# Patient Record
Sex: Female | Born: 1980
Health system: Southern US, Community
[De-identification: ages and names within clinical notes are randomized; demographics above are authoritative.]

## PROBLEM LIST (undated history)

## (undated) ENCOUNTER — Inpatient Hospital Stay (HOSPITAL_COMMUNITY): Payer: Self-pay

## (undated) DIAGNOSIS — O139 Gestational [pregnancy-induced] hypertension without significant proteinuria, unspecified trimester: Secondary | ICD-10-CM

## (undated) HISTORY — PX: BACK SURGERY: SHX140

## (undated) HISTORY — PX: WISDOM TOOTH EXTRACTION: SHX21

---

## 2004-03-23 ENCOUNTER — Other Ambulatory Visit: Admission: RE | Admit: 2004-03-23 | Discharge: 2004-03-23 | Payer: Self-pay | Admitting: Obstetrics and Gynecology

## 2004-05-02 ENCOUNTER — Ambulatory Visit (HOSPITAL_COMMUNITY): Admission: RE | Admit: 2004-05-02 | Discharge: 2004-05-02 | Payer: Self-pay | Admitting: Chiropractic Medicine

## 2005-03-28 ENCOUNTER — Other Ambulatory Visit: Admission: RE | Admit: 2005-03-28 | Discharge: 2005-03-28 | Payer: Self-pay | Admitting: Obstetrics and Gynecology

## 2011-04-28 ENCOUNTER — Emergency Department (HOSPITAL_BASED_OUTPATIENT_CLINIC_OR_DEPARTMENT_OTHER)
Admission: EM | Admit: 2011-04-28 | Discharge: 2011-04-28 | Disposition: A | Discharge status: Home / Self Care | Payer: BC Managed Care – PPO | Attending: Emergency Medicine | Admitting: Emergency Medicine

## 2011-04-28 ENCOUNTER — Encounter: Payer: Self-pay | Admitting: *Deleted

## 2011-04-28 DIAGNOSIS — H53149 Visual discomfort, unspecified: Secondary | ICD-10-CM | POA: Insufficient documentation

## 2011-04-28 DIAGNOSIS — F17200 Nicotine dependence, unspecified, uncomplicated: Secondary | ICD-10-CM | POA: Insufficient documentation

## 2011-04-28 DIAGNOSIS — H5710 Ocular pain, unspecified eye: Secondary | ICD-10-CM | POA: Insufficient documentation

## 2011-04-28 DIAGNOSIS — H209 Unspecified iridocyclitis: Secondary | ICD-10-CM | POA: Insufficient documentation

## 2011-04-28 MED ORDER — HOMATROPINE HBR 5 % OP SOLN: 1.0000 [drp] | Freq: Two times a day (BID) | OPHTHALMIC | Status: AC

## 2011-04-28 MED ORDER — PREDNISOLONE ACETATE 1 % OP SUSP: 1.0000 [drp] | Freq: Four times a day (QID) | OPHTHALMIC | Status: AC

## 2011-04-28 MED ORDER — FLUORESCEIN SODIUM 1 MG OP STRP
ORAL_STRIP | OPHTHALMIC | Status: AC
  Filled 2011-04-28: qty 1

## 2011-04-28 MED ORDER — MOXIFLOXACIN HCL 0.5 % OP SOLN: 1.0000 [drp] | Freq: Four times a day (QID) | OPHTHALMIC | Status: AC

## 2011-04-28 MED ORDER — OXYCODONE-ACETAMINOPHEN 5-325 MG PO TABS: 2.0000 | ORAL_TABLET | ORAL | Status: AC | PRN

## 2011-04-28 MED ORDER — OXYCODONE-ACETAMINOPHEN 5-325 MG PO TABS
2.0000 | ORAL_TABLET | Freq: Once | ORAL | Status: AC
  Administered 2011-04-28: 2 via ORAL
  Filled 2011-04-28: qty 2

## 2011-04-28 MED ORDER — TETRACAINE HCL 0.5 % OP SOLN
OPHTHALMIC | Status: AC
  Filled 2011-04-28: qty 2

## 2011-04-28 NOTE — ED Notes (Signed)
Patient states that her eye began to irritate her, so she took her contacts out. Then the eye pain worsened, now drainage and swelling also.

## 2011-04-28 NOTE — ED Provider Notes (Signed)
History     CSN: [id number] Arrival date & time: 04/28/2011  6:07 AM   First MD Initiated Contact with Patient 04/28/11 [id number]      Chief Complaint  Patient presents with  . Eye Pain    (Consider location/radiation/quality/duration/timing/severity/associated sxs/prior treatment) HPI Comments: Pt wears contacts.  Contact started irritating her today, took it out and eye has become increasingly red with watery drainage, diffuse pain.  Was recently treated for red eye/possible conjunctivits about one week ago  Patient is a 30 y.o. female presenting with eye pain. The history is provided by the patient.  Eye Pain This is a new problem. The current episode started 6 to 12 hours ago. The problem occurs constantly. The problem has been gradually worsening. Pertinent negatives include no headaches and no shortness of breath. Exacerbated by: light. The symptoms are relieved by nothing.    History reviewed. No pertinent past medical history.  History reviewed. No pertinent past surgical history.  No family history on file.  History  Substance Use Topics  . Smoking status: Current Everyday Smoker  . Smokeless tobacco: Not on file  . Alcohol Use:     OB History    Grav Para Term Preterm Abortions TAB SAB Ect Mult Living                  Review of Systems  Constitutional: Negative for fever.  HENT: Negative for congestion, rhinorrhea and neck pain.   Eyes: Positive for photophobia, pain, discharge, redness and visual disturbance. Negative for itching.  Respiratory: Negative for cough and shortness of breath.   Gastrointestinal: Negative.  Negative for nausea and vomiting.  Genitourinary: Negative.   Musculoskeletal: Negative.   Skin: Negative for rash.  Neurological: Negative for headaches.    Allergies  Review of patient's allergies indicates no known allergies.  Home Medications   Current Outpatient Rx  Name Route Sig Dispense Refill  . BUPROPION HCL ER (XL) 300 MG PO  TB24 Oral Take 300 mg by mouth daily.      Marland Kitchen HOMATROPINE HBR 5 % OP SOLN Left Eye Place 1 drop into the left eye 2 (two) times daily. 15 mL 0  . MOXIFLOXACIN HCL 0.5 % OP SOLN Left Eye Place 1 drop into the left eye 4 (four) times daily. 3 mL 0  . OXYCODONE-ACETAMINOPHEN 5-325 MG PO TABS Oral Take 2 tablets by mouth every 4 (four) hours as needed for pain. 15 tablet 0  . PREDNISOLONE ACETATE 1 % OP SUSP Left Eye Place 1 drop into the left eye 4 (four) times daily. 5 mL 0    BP 139/83  Pulse 77  Temp 97.9 F (36.6 C)  Resp 20  SpO2 99%  Physical Exam  Constitutional: She is oriented to person, place, and time. She appears well-developed and well-nourished.  HENT:  Head: Normocephalic and atraumatic.  Eyes: Lids are normal. Pupils are equal, round, and reactive to light. No foreign bodies found. Left eye exhibits no exudate. No foreign body present in the left eye. Left conjunctiva is injected. Left conjunctiva has no hemorrhage. Left eye exhibits normal extraocular motion and no nystagmus.  Slit lamp exam:      The left eye shows no corneal abrasion, no corneal flare, no foreign body, no hyphema and no fluorescein uptake.  Neck: Normal range of motion. Neck supple.  Cardiovascular: Normal rate, regular rhythm and normal heart sounds.   Pulmonary/Chest: Effort normal and breath sounds normal. No respiratory distress. She has no  wheezes. She has no rales. She exhibits no tenderness.  Abdominal: Soft. Bowel sounds are normal. There is no tenderness. There is no rebound and no guarding.  Musculoskeletal: Normal range of motion. She exhibits no edema.  Lymphadenopathy:    She has no cervical adenopathy.  Neurological: She is alert and oriented to person, place, and time.  Skin: Skin is warm and dry. No rash noted.  Psychiatric: She has a normal mood and affect.    ED Course  Procedures (including critical care time)  Labs Reviewed - No data to display No results found.   1. Iritis         MDM  Iritis vs conjuntivits vs corneal abrasion   Spoke with Dr. Allyne Gee who is on call for her eye doctor, Dr. Abel Presto.  He recommends starting vigamox, homatropine and predforte drops.  Has appt in 2 days with Dr. Abel Presto, but can call Dr. Allyne Gee if she has worsening problems today.  Advised not to wear contacts       Rolan Bucco, MD 04/28/11 [phone]

## 2014-02-08 ENCOUNTER — Other Ambulatory Visit: Payer: Self-pay | Admitting: Neurological Surgery

## 2014-02-08 DIAGNOSIS — M5416 Radiculopathy, lumbar region: Secondary | ICD-10-CM

## 2014-02-18 ENCOUNTER — Ambulatory Visit
Admission: RE | Admit: 2014-02-18 | Discharge: 2014-02-18 | Disposition: A | Discharge status: Home / Self Care | Payer: BC Managed Care – PPO | Source: Ambulatory Visit | Attending: Neurological Surgery | Admitting: Neurological Surgery

## 2014-02-18 DIAGNOSIS — M5416 Radiculopathy, lumbar region: Secondary | ICD-10-CM

## 2016-03-26 LAB — OB RESULTS CONSOLE HEPATITIS B SURFACE ANTIGEN: Hepatitis B Surface Ag: NEGATIVE

## 2016-03-26 LAB — OB RESULTS CONSOLE ABO/RH: RH Type: POSITIVE

## 2016-03-26 LAB — OB RESULTS CONSOLE HGB/HCT, BLOOD
HEMATOCRIT: 39 %
HEMOGLOBIN: 12.9 g/dL

## 2016-03-26 LAB — OB RESULTS CONSOLE HIV ANTIBODY (ROUTINE TESTING): HIV: NONREACTIVE

## 2016-03-26 LAB — OB RESULTS CONSOLE GC/CHLAMYDIA
CHLAMYDIA, DNA PROBE: NEGATIVE
Gonorrhea: NEGATIVE

## 2016-03-26 LAB — OB RESULTS CONSOLE RPR: RPR: NONREACTIVE

## 2016-03-26 LAB — OB RESULTS CONSOLE PLATELET COUNT: PLATELETS: 269 10*3/uL

## 2016-03-26 LAB — OB RESULTS CONSOLE RUBELLA ANTIBODY, IGM: Rubella: IMMUNE

## 2016-04-27 ENCOUNTER — Inpatient Hospital Stay (HOSPITAL_COMMUNITY): Payer: BLUE CROSS/BLUE SHIELD

## 2016-04-27 ENCOUNTER — Encounter (HOSPITAL_COMMUNITY): Payer: Self-pay

## 2016-04-27 ENCOUNTER — Inpatient Hospital Stay (HOSPITAL_COMMUNITY)
Admission: AD | Admit: 2016-04-27 | Discharge: 2016-04-27 | Disposition: A | Discharge status: Home / Self Care | Payer: BLUE CROSS/BLUE SHIELD | Source: Ambulatory Visit | Attending: Obstetrics and Gynecology | Admitting: Obstetrics and Gynecology

## 2016-04-27 DIAGNOSIS — O209 Hemorrhage in early pregnancy, unspecified: Secondary | ICD-10-CM | POA: Insufficient documentation

## 2016-04-27 DIAGNOSIS — Z79899 Other long term (current) drug therapy: Secondary | ICD-10-CM | POA: Insufficient documentation

## 2016-04-27 DIAGNOSIS — O4692 Antepartum hemorrhage, unspecified, second trimester: Secondary | ICD-10-CM

## 2016-04-27 DIAGNOSIS — Z3A14 14 weeks gestation of pregnancy: Secondary | ICD-10-CM | POA: Insufficient documentation

## 2016-04-27 LAB — URINE MICROSCOPIC-ADD ON

## 2016-04-27 LAB — URINALYSIS, ROUTINE W REFLEX MICROSCOPIC
Bilirubin Urine: NEGATIVE
Glucose, UA: NEGATIVE mg/dL
Ketones, ur: NEGATIVE mg/dL
Leukocytes, UA: NEGATIVE
Nitrite: NEGATIVE
Protein, ur: NEGATIVE mg/dL
Specific Gravity, Urine: 1.02 (ref 1.005–1.030)
pH: 6 (ref 5.0–8.0)

## 2016-04-27 NOTE — Discharge Instructions (Signed)
Keep your appointment in the office on Monday. Pelvic rest. No heavy lifting or strenuous exercise. Return if you have worsening symptoms. Drink at least 8 8-oz glasses of water every day. Continue your prenatal vitamins.

## 2016-04-27 NOTE — MAU Note (Signed)
Patient is [redacted] weeks pregnant has been seen by Dr. Vance GatherLowe's office for this pregnancy, woke up this morning with blood in bed.

## 2016-04-27 NOTE — MAU Provider Note (Signed)
History     CSN: [id number]  Arrival date and time: 04/27/16 [id number]   Seen by provider at 0810    Chief Complaint  Patient presents with  . Vaginal Bleeding   HPI Angel Quinn 35 y.o. 1440w1d Awakened this morning and thought she has lost some urine.  Went to the bathroom and saw blood in the toilet.  Blood was on her sheets.  She did not have cramping at that time.  Sees Dr. Rana SnareLowe for her care and has an appointment scheduled tomorrow.  Vaginal bleeding has decreased at this time.  OB History    Gravida Para Term Preterm AB Living   1             SAB TAB Ectopic Multiple Live Births                  No past medical history on file.  Past Surgical History:  Procedure Laterality Date  . BACK SURGERY    . WISDOM TOOTH EXTRACTION      No family history on file.  Social History  Substance Use Topics  . Smoking status: Never Smoker  . Smokeless tobacco: Never Used  . Alcohol use No    Allergies: No Known Allergies  Prescriptions Prior to Admission  Medication Sig Dispense Refill Last Dose  . buPROPion (WELLBUTRIN XL) 300 MG 24 hr tablet Take 300 mg by mouth daily.     Not Taking at Unknown time    Review of Systems  Constitutional: Negative for fever.  Gastrointestinal: Negative for abdominal pain, nausea and vomiting.  Genitourinary:       No vaginal discharge. Vaginal bleeding. No dysuria.   Physical Exam   Blood pressure 134/72, pulse 90, temperature 98.2 F (36.8 C), temperature source Oral.  Physical Exam  Nursing note and vitals reviewed. Constitutional: She is oriented to person, place, and time. She appears well-developed and well-nourished.  HENT:  Head: Normocephalic.  Eyes: EOM are normal.  Neck: Neck supple.  Respiratory: Effort normal.  GI: Soft. There is no tenderness.  Musculoskeletal: Normal range of motion.  Neurological: She is alert and oriented to person, place, and time.  Skin: Skin is warm and dry.  Psychiatric: She has a  normal mood and affect.    MAU Course  Procedures  Ultrasound done and preliminary report obtained - 1540w1d gestation with FHT of 143 and sbuchorionic hemorrhage 3.7 X 1.6 X 4.8 cm   Results for orders placed or performed during the hospital encounter of 04/27/16 (from the past 24 hour(s))  Urinalysis, Routine w reflex microscopic (not [facility])     Status: Abnormal   Collection Time: 04/27/16  7:35 AM  Result Value Ref Range   Color, Urine YELLOW YELLOW   APPearance CLEAR CLEAR   Specific Gravity, Urine 1.020 1.005 - 1.030   pH 6.0 5.0 - 8.0   Glucose, UA NEGATIVE NEGATIVE mg/dL   Hgb urine dipstick MODERATE (A) NEGATIVE   Bilirubin Urine NEGATIVE NEGATIVE   Ketones, ur NEGATIVE NEGATIVE mg/dL   Protein, ur NEGATIVE NEGATIVE mg/dL   Nitrite NEGATIVE NEGATIVE   Leukocytes, UA NEGATIVE NEGATIVE  Urine microscopic-add on     Status: Abnormal   Collection Time: 04/27/16  7:35 AM  Result Value Ref Range   Squamous Epithelial / LPF 0-5 (A) NONE SEEN   WBC, UA 0-5 0 - 5 WBC/hpf   RBC / HPF 0-5 0 - 5 RBC/hpf   Bacteria, UA MANY (A) NONE SEEN  MDM Discussed the plan of care with Dr. Marcelle OverlieHolland.  Client is not having bleeding by the time of discharge today.  Assessment and Plan  Vaginal bleeding in second trimester secondary to subchorionic hemorrhage  Plan Keep your appointment in the office on Monday. Pelvic rest for now. No treatment for the bleeding is indicated at this time. Return if you have severe vaginal bleeding.  Terri L Burleson 04/27/2016, 9:34 AM

## 2016-07-25 DIAGNOSIS — Z3480 Encounter for supervision of other normal pregnancy, unspecified trimester: Secondary | ICD-10-CM | POA: Diagnosis not present

## 2016-07-25 DIAGNOSIS — Z23 Encounter for immunization: Secondary | ICD-10-CM | POA: Diagnosis not present

## 2016-07-25 DIAGNOSIS — Z3400 Encounter for supervision of normal first pregnancy, unspecified trimester: Secondary | ICD-10-CM | POA: Diagnosis not present

## 2016-10-03 ENCOUNTER — Encounter (HOSPITAL_COMMUNITY): Payer: Self-pay

## 2016-10-03 ENCOUNTER — Encounter (HOSPITAL_COMMUNITY): Payer: Self-pay | Admitting: *Deleted

## 2016-10-03 ENCOUNTER — Inpatient Hospital Stay (HOSPITAL_COMMUNITY)
Admission: AD | Admit: 2016-10-03 | Discharge: 2016-10-03 | Disposition: A | Discharge status: Home / Self Care | Payer: 59 | Source: Ambulatory Visit | Attending: Obstetrics and Gynecology | Admitting: Obstetrics and Gynecology

## 2016-10-03 ENCOUNTER — Encounter (HOSPITAL_COMMUNITY)
Admit: 2016-10-03 | Discharge: 2016-10-03 | Disposition: A | Discharge status: Home / Self Care | Payer: 59 | Attending: Obstetrics & Gynecology | Admitting: Obstetrics & Gynecology

## 2016-10-03 DIAGNOSIS — O1403 Mild to moderate pre-eclampsia, third trimester: Secondary | ICD-10-CM

## 2016-10-03 DIAGNOSIS — O163 Unspecified maternal hypertension, third trimester: Secondary | ICD-10-CM

## 2016-10-03 DIAGNOSIS — Z3480 Encounter for supervision of other normal pregnancy, unspecified trimester: Secondary | ICD-10-CM | POA: Diagnosis not present

## 2016-10-03 DIAGNOSIS — Z3A37 37 weeks gestation of pregnancy: Secondary | ICD-10-CM

## 2016-10-03 DIAGNOSIS — R030 Elevated blood-pressure reading, without diagnosis of hypertension: Secondary | ICD-10-CM | POA: Diagnosis not present

## 2016-10-03 DIAGNOSIS — O1404 Mild to moderate pre-eclampsia, complicating childbirth: Secondary | ICD-10-CM | POA: Diagnosis not present

## 2016-10-03 HISTORY — DX: Gestational (pregnancy-induced) hypertension without significant proteinuria, unspecified trimester: O13.9

## 2016-10-03 LAB — URINALYSIS, ROUTINE W REFLEX MICROSCOPIC
Bilirubin Urine: NEGATIVE
Glucose, UA: NEGATIVE mg/dL
Hgb urine dipstick: NEGATIVE
Ketones, ur: NEGATIVE mg/dL
Leukocytes, UA: NEGATIVE
NITRITE: NEGATIVE
Protein, ur: 30 mg/dL — AB
Specific Gravity, Urine: 1.004 — ABNORMAL LOW (ref 1.005–1.030)
pH: 7 (ref 5.0–8.0)

## 2016-10-03 LAB — COMPREHENSIVE METABOLIC PANEL
ALT: 21 U/L (ref 14–54)
AST: 22 U/L (ref 15–41)
Albumin: 3.1 g/dL — ABNORMAL LOW (ref 3.5–5.0)
Alkaline Phosphatase: 125 U/L (ref 38–126)
Anion gap: 9 (ref 5–15)
BUN: 14 mg/dL (ref 6–20)
CO2: 20 mmol/L — ABNORMAL LOW (ref 22–32)
Calcium: 9.2 mg/dL (ref 8.9–10.3)
Chloride: 104 mmol/L (ref 101–111)
Creatinine, Ser: 0.6 mg/dL (ref 0.44–1.00)
GFR calc Af Amer: >60 mL/min (ref >60)
GFR calc non Af Amer: >60 mL/min (ref >60)
Glucose, Bld: 87 mg/dL (ref 65–99)
Potassium: 4.1 mmol/L (ref 3.5–5.1)
Sodium: 133 mmol/L — ABNORMAL LOW (ref 135–145)
Total Bilirubin: 0.5 mg/dL (ref 0.3–1.2)
Total Protein: 6.7 g/dL (ref 6.5–8.1)

## 2016-10-03 LAB — CBC
HCT: 37.9 % (ref 36.0–46.0)
Hemoglobin: 12.9 g/dL (ref 12.0–15.0)
MCH: 30.4 pg (ref 26.0–34.0)
MCHC: 34 g/dL (ref 30.0–36.0)
MCV: 89.2 fL (ref 78.0–100.0)
Platelets: 253 10*3/uL (ref 150–400)
RBC: 4.25 MIL/uL (ref 3.87–5.11)
RDW: 14.9 % (ref 11.5–15.5)
WBC: 10.8 10*3/uL — ABNORMAL HIGH (ref 4.0–10.5)

## 2016-10-03 LAB — ABO/RH: ABO/RH(D): B POS

## 2016-10-03 LAB — PROTEIN / CREATININE RATIO, URINE
Creatinine, Urine: 19 mg/dL
Protein Creatinine Ratio: 1.63 mg/mg{Cre} — ABNORMAL HIGH (ref 0.00–0.15)
Total Protein, Urine: 31 mg/dL

## 2016-10-03 NOTE — Patient Instructions (Signed)
22 Angel Quinn  10/03/2016   Your procedure is scheduled on:  10/04/2016  Enter through the Main Entrance of [facility] at 0630 AM.  Pick up the phone at the desk and dial [phone].   Call this number if you have problems the morning of surgery: [phone]   Remember:   Do not eat food:After Midnight.  Do not drink clear liquids: After Midnight.  Take these medicines the morning of surgery with A SIP OF WATER: none   Do not wear jewelry, make-up or nail polish.  Do not wear lotions, powders, or perfumes. Do not wear deodorant.  Do not shave 48 hours prior to surgery.  Do not bring valuables to the hospital.  [facility] is not   responsible for any belongings or valuables brought to the hospital.  Contacts, dentures or bridgework may not be worn into surgery.  Leave suitcase in the car. After surgery it may be brought to your room.  For patients admitted to the hospital, checkout time is 11:00 AM the day of              discharge.   Patients discharged the day of surgery will not be allowed to drive             home.  Name and phone number of your driver: na  Special Instructions:   N/A   Please read over the following fact sheets that you were given:   Surgical Site Infection Prevention

## 2016-10-03 NOTE — H&P (Signed)
Larsen Zettel is a 36 y.o. female presenting for primary elective C/S at 37 weeks for pre-eclampsia without severe features.  Antepartum course complicated by history of laminectomy.  The patient does not want TOL.  GBS pending.  OB History    Gravida Para Term Preterm AB Living   1             SAB TAB Ectopic Multiple Live Births                 Past Medical History:  Diagnosis Date  . Pregnancy induced hypertension    Past Surgical History:  Procedure Laterality Date  . BACK SURGERY    . WISDOM TOOTH EXTRACTION     Family History: family history includes Aortic stenosis in her father; Diabetes in her father, mother, and sister; Hyperlipidemia in her father and mother; Hypertension in her father and mother. Social History:  reports that she has never smoked. She has never used smokeless tobacco. She reports that she does not drink alcohol or use drugs.     Maternal Diabetes: No Genetic Screening: Normal Maternal Ultrasounds/Referrals: Normal Fetal Ultrasounds or other Referrals:  None Maternal Substance Abuse:  No Significant Maternal Medications:  None Significant Maternal Lab Results:  None Other Comments:  None  ROS Maternal Medical History:  Prenatal complications: Pre-eclampsia.   Prenatal Complications - Diabetes: none.      There were no vitals taken for this visit. Maternal Exam:  Abdomen: Fundal height is c/w dates.   Estimated fetal weight is 7#.       Physical Exam  Constitutional: She is oriented to person, place, and time. She appears well-developed and well-nourished.  GI: Soft. There is no rebound and no guarding.  Neurological: She is alert and oriented to person, place, and time.  Skin: Skin is warm and dry.  Psychiatric: She has a normal mood and affect. Her behavior is normal.    Prenatal labs: ABO, Rh: --/--/B POS, B POS (04/05 1130) Antibody: NEG (04/05 1130) Rubella:   RPR:    HBsAg:    HIV:    GBS:     Assessment/Plan: 36yo G1 at  37 weeks for primary elective C/S for pre-eclampsia without severe features. -Patient has been counseled re: risk of bleeding, infection, scarring and damage to surrounding structures.  All questions were answered and the patient wishes to proceed.  Calayah Guadarrama 10/03/2016, 6:08 PM

## 2016-10-03 NOTE — H&P (Signed)
Angel Quinn is a 36 y.o. female G1 @ 36+6 wks presenting for evaluation of elevated BP.  Pt was seen in office and noted to have increase in BP with proteinuria.  Denies HA, N/V,and RUQ pain.  Good FM.  Pt scheduled for primary c-section 10/18/16 b/c of h/o laminectomy - declines TOL.  OB History    Gravida Para Term Preterm AB Living   1             SAB TAB Ectopic Multiple Live Births                 History reviewed. No pertinent past medical history. Past Surgical History:  Procedure Laterality Date  . BACK SURGERY    . WISDOM TOOTH EXTRACTION     Family History: family history is not on file. Social History:  reports that she has never smoked. She has never used smokeless tobacco. She reports that she does not drink alcohol or use drugs.     Maternal Diabetes: No Genetic Screening: Declined Maternal Ultrasounds/Referrals: Normal Fetal Ultrasounds or other Referrals:  None Maternal Substance Abuse:  No Significant Maternal Medications:  None Significant Maternal Lab Results:  None Other Comments:  None  ROS History   Blood pressure 130/87, pulse 96, temperature 97.2 F (36.2 C), temperature source Oral, resp. rate 17, height  (1.702 m), weight 248 lb (112.5 kg), SpO2 99 %. Exam Physical Exam  Gen - NAD CV - RRR Lungs - clear Abd - gravid, NT Ext - 2+ bilateral edema  Results for orders placed or performed during the hospital encounter of 10/03/16 (from the past 24 hour(s))  Protein / creatinine ratio, urine     Status: Abnormal   Collection Time: 10/03/16 11:15 AM  Result Value Ref Range   Creatinine, Urine 19.00 mg/dL   Total Protein, Urine 31 mg/dL   Protein Creatinine Ratio 1.63 (H) 0.00 - 0.15 mg/mg[Cre]  Urinalysis, Routine w reflex microscopic     Status: Abnormal   Collection Time: 10/03/16 11:15 AM  Result Value Ref Range   Color, Urine STRAW (A) YELLOW   APPearance CLEAR CLEAR   Specific Gravity, Urine 1.004 (L) 1.005 - 1.030   pH 7.0 5.0 -  8.0   Glucose, UA NEGATIVE NEGATIVE mg/dL   Hgb urine dipstick NEGATIVE NEGATIVE   Bilirubin Urine NEGATIVE NEGATIVE   Ketones, ur NEGATIVE NEGATIVE mg/dL   Protein, ur 30 (A) NEGATIVE mg/dL   Nitrite NEGATIVE NEGATIVE   Leukocytes, UA NEGATIVE NEGATIVE   RBC / HPF 0-5 0 - 5 RBC/hpf   WBC, UA 0-5 0 - 5 WBC/hpf   Bacteria, UA MANY (A) NONE SEEN   Squamous Epithelial / LPF 0-5 (A) NONE SEEN  CBC     Status: Abnormal   Collection Time: 10/03/16 11:30 AM  Result Value Ref Range   WBC 10.8 (H) 4.0 - 10.5 K/uL   RBC 4.25 3.87 - 5.11 MIL/uL   Hemoglobin 12.9 12.0 - 15.0 g/dL   HCT 11.9 14.7 - 82.9 %   MCV 89.2 78.0 - 100.0 fL   MCH 30.4 26.0 - 34.0 pg   MCHC 34.0 30.0 - 36.0 g/dL   RDW 56.2 13.0 - 86.5 %   Platelets 253 150 - 400 K/uL  Comprehensive metabolic panel     Status: Abnormal   Collection Time: 10/03/16 11:30 AM  Result Value Ref Range   Sodium 133 (L) 135 - 145 mmol/L   Potassium 4.1 3.5 - 5.1 mmol/L  Chloride 104 101 - 111 mmol/L   CO2 20 (L) 22 - 32 mmol/L   Glucose, Bld 87 65 - 99 mg/dL   BUN 14 6 - 20 mg/dL   Creatinine, Ser 1.61 0.44 - 1.00 mg/dL   Calcium 9.2 8.9 - 09.6 mg/dL   Total Protein 6.7 6.5 - 8.1 g/dL   Albumin 3.1 (L) 3.5 - 5.0 g/dL   AST 22 15 - 41 U/L   ALT 21 14 - 54 U/L   Alkaline Phosphatase 125 38 - 126 U/L   Total Bilirubin 0.5 0.3 - 1.2 mg/dL   GFR calc non Af Amer >60 >60 mL/min   GFR calc Af Amer >60 >60 mL/min   Anion gap 9 5 - 15     Assessment/Plan: Mild pre-eclampsia - will plan for delivery tomorrow at 37 weeks Pt requests primary c-section - scheduled 10/04/16 @ 8:30am   Angel Quinn 10/03/2016, 1:08 PM

## 2016-10-03 NOTE — MAU Note (Signed)
Pt in office today for routine visit and had protein in her urine and b/p was 150/94. States she has had increased swelling in her feet and hands and has had some floaters in her vision.

## 2016-10-04 ENCOUNTER — Inpatient Hospital Stay (HOSPITAL_COMMUNITY): Payer: 59 | Admitting: Certified Registered Nurse Anesthetist

## 2016-10-04 ENCOUNTER — Inpatient Hospital Stay (HOSPITAL_COMMUNITY)
Admission: RE | Admit: 2016-10-04 | Discharge: 2016-10-08 | DRG: 766 | Disposition: A | Discharge status: Home / Self Care | Payer: 59 | Source: Ambulatory Visit | Attending: Obstetrics & Gynecology | Admitting: Obstetrics & Gynecology

## 2016-10-04 ENCOUNTER — Encounter (HOSPITAL_COMMUNITY)
Admission: RE | Disposition: A | Discharge status: Home / Self Care | Payer: Self-pay | Source: Ambulatory Visit | Attending: Obstetrics & Gynecology

## 2016-10-04 ENCOUNTER — Encounter (HOSPITAL_COMMUNITY): Payer: Self-pay | Admitting: *Deleted

## 2016-10-04 DIAGNOSIS — O1490 Unspecified pre-eclampsia, unspecified trimester: Secondary | ICD-10-CM | POA: Diagnosis present

## 2016-10-04 DIAGNOSIS — R030 Elevated blood-pressure reading, without diagnosis of hypertension: Secondary | ICD-10-CM | POA: Diagnosis present

## 2016-10-04 DIAGNOSIS — O1494 Unspecified pre-eclampsia, complicating childbirth: Secondary | ICD-10-CM | POA: Diagnosis not present

## 2016-10-04 DIAGNOSIS — O1404 Mild to moderate pre-eclampsia, complicating childbirth: Principal | ICD-10-CM | POA: Diagnosis present

## 2016-10-04 DIAGNOSIS — Z3A37 37 weeks gestation of pregnancy: Secondary | ICD-10-CM | POA: Diagnosis not present

## 2016-10-04 LAB — CBC
HCT: 38.3 % (ref 36.0–46.0)
HEMOGLOBIN: 12.8 g/dL (ref 12.0–15.0)
MCH: 29.6 pg (ref 26.0–34.0)
MCHC: 33.4 g/dL (ref 30.0–36.0)
MCV: 88.7 fL (ref 78.0–100.0)
Platelets: 251 10*3/uL (ref 150–400)
RBC: 4.32 MIL/uL (ref 3.87–5.11)
RDW: 15 % (ref 11.5–15.5)
WBC: 10.7 10*3/uL — AB (ref 4.0–10.5)

## 2016-10-04 LAB — COMPREHENSIVE METABOLIC PANEL
ALK PHOS: 118 U/L (ref 38–126)
ALT: 22 U/L (ref 14–54)
AST: 27 U/L (ref 15–41)
Albumin: 3 g/dL — ABNORMAL LOW (ref 3.5–5.0)
Anion gap: 10 (ref 5–15)
BILIRUBIN TOTAL: 0.6 mg/dL (ref 0.3–1.2)
BUN: 15 mg/dL (ref 6–20)
CALCIUM: 9.4 mg/dL (ref 8.9–10.3)
CO2: 19 mmol/L — ABNORMAL LOW (ref 22–32)
CREATININE: 0.59 mg/dL (ref 0.44–1.00)
Chloride: 106 mmol/L (ref 101–111)
GFR calc Af Amer: >60 mL/min (ref >60)
GFR calc non Af Amer: >60 mL/min (ref >60)
GLUCOSE: 97 mg/dL (ref 65–99)
Potassium: 4.3 mmol/L (ref 3.5–5.1)
Sodium: 135 mmol/L (ref 135–145)
TOTAL PROTEIN: 6.5 g/dL (ref 6.5–8.1)

## 2016-10-04 LAB — TYPE AND SCREEN
ABO/RH(D): B POS
ABO/RH(D): B POS
Antibody Screen: NEGATIVE
Antibody Screen: NEGATIVE

## 2016-10-04 LAB — RPR
RPR Ser Ql: NONREACTIVE
RPR: NONREACTIVE

## 2016-10-04 SURGERY — Surgical Case
Anesthesia: Spinal

## 2016-10-04 MED ORDER — FENTANYL CITRATE (PF) 100 MCG/2ML IJ SOLN
INTRAMUSCULAR | Status: AC
  Filled 2016-10-04: qty 2

## 2016-10-04 MED ORDER — SIMETHICONE 80 MG PO CHEW: 80.0000 mg | CHEWABLE_TABLET | ORAL | Status: DC | PRN

## 2016-10-04 MED ORDER — NALBUPHINE HCL 10 MG/ML IJ SOLN: 5.0000 mg | Freq: Once | INTRAMUSCULAR | Status: DC | PRN

## 2016-10-04 MED ORDER — MORPHINE SULFATE (PF) 0.5 MG/ML IJ SOLN
INTRAMUSCULAR | Status: DC | PRN
  Administered 2016-10-04: .2 mg via INTRATHECAL

## 2016-10-04 MED ORDER — DIPHENHYDRAMINE HCL 50 MG/ML IJ SOLN: 12.5000 mg | INTRAMUSCULAR | Status: DC | PRN

## 2016-10-04 MED ORDER — SOD CITRATE-CITRIC ACID 500-334 MG/5ML PO SOLN
30.0000 mL | Freq: Once | ORAL | Status: AC
  Administered 2016-10-04: 30 mL via ORAL
  Filled 2016-10-04: qty 15

## 2016-10-04 MED ORDER — SCOPOLAMINE 1 MG/3DAYS TD PT72
1.0000 | MEDICATED_PATCH | TRANSDERMAL | Status: DC
  Administered 2016-10-04: 1.5 mg via TRANSDERMAL
  Filled 2016-10-04: qty 1

## 2016-10-04 MED ORDER — ONDANSETRON HCL 4 MG/2ML IJ SOLN
INTRAMUSCULAR | Status: DC | PRN
  Administered 2016-10-04: 4 mg via INTRAVENOUS

## 2016-10-04 MED ORDER — DIBUCAINE 1 % RE OINT: 1.0000 "application " | TOPICAL_OINTMENT | RECTAL | Status: DC | PRN

## 2016-10-04 MED ORDER — PHENYLEPHRINE 8 MG IN D5W 100 ML (0.08MG/ML) PREMIX OPTIME
INJECTION | INTRAVENOUS | Status: AC
  Filled 2016-10-04: qty 100

## 2016-10-04 MED ORDER — ONDANSETRON HCL 4 MG/2ML IJ SOLN
INTRAMUSCULAR | Status: AC
  Filled 2016-10-04: qty 2

## 2016-10-04 MED ORDER — ZOLPIDEM TARTRATE 5 MG PO TABS
5.0000 mg | ORAL_TABLET | Freq: Every evening | ORAL | Status: DC | PRN
  Administered 2016-10-06 – 2016-10-07 (×2): 5 mg via ORAL
  Filled 2016-10-04 (×2): qty 1

## 2016-10-04 MED ORDER — KETOROLAC TROMETHAMINE 30 MG/ML IJ SOLN: 30.0000 mg | Freq: Four times a day (QID) | INTRAMUSCULAR | Status: DC | PRN

## 2016-10-04 MED ORDER — SCOPOLAMINE 1 MG/3DAYS TD PT72
1.0000 | MEDICATED_PATCH | Freq: Once | TRANSDERMAL | Status: DC
  Filled 2016-10-04: qty 1

## 2016-10-04 MED ORDER — KETOROLAC TROMETHAMINE 30 MG/ML IJ SOLN
30.0000 mg | Freq: Once | INTRAMUSCULAR | Status: DC | PRN
  Administered 2016-10-04: 30 mg via INTRAVENOUS

## 2016-10-04 MED ORDER — CEFAZOLIN SODIUM-DEXTROSE 2-4 GM/100ML-% IV SOLN
2.0000 g | INTRAVENOUS | Status: AC
  Administered 2016-10-04: 2 g via INTRAVENOUS
  Filled 2016-10-04: qty 100

## 2016-10-04 MED ORDER — OXYTOCIN 10 UNIT/ML IJ SOLN
INTRAMUSCULAR | Status: AC
  Filled 2016-10-04: qty 4

## 2016-10-04 MED ORDER — SENNOSIDES-DOCUSATE SODIUM 8.6-50 MG PO TABS
2.0000 | ORAL_TABLET | ORAL | Status: DC
  Administered 2016-10-05 – 2016-10-07 (×4): 2 via ORAL
  Filled 2016-10-04 (×4): qty 2

## 2016-10-04 MED ORDER — IBUPROFEN 600 MG PO TABS
600.0000 mg | ORAL_TABLET | Freq: Four times a day (QID) | ORAL | Status: DC
  Administered 2016-10-04 – 2016-10-08 (×14): 600 mg via ORAL
  Filled 2016-10-04 (×14): qty 1

## 2016-10-04 MED ORDER — FENTANYL CITRATE (PF) 100 MCG/2ML IJ SOLN
INTRAMUSCULAR | Status: DC | PRN
  Administered 2016-10-04: 10 ug via INTRATHECAL

## 2016-10-04 MED ORDER — MENTHOL 3 MG MT LOZG: 1.0000 | LOZENGE | OROMUCOSAL | Status: DC | PRN

## 2016-10-04 MED ORDER — BUPIVACAINE IN DEXTROSE 0.75-8.25 % IT SOLN
INTRATHECAL | Status: DC | PRN
  Administered 2016-10-04: 1.6 mL via INTRATHECAL

## 2016-10-04 MED ORDER — NALBUPHINE HCL 10 MG/ML IJ SOLN: 5.0000 mg | INTRAMUSCULAR | Status: DC | PRN

## 2016-10-04 MED ORDER — ACETAMINOPHEN 325 MG PO TABS
650.0000 mg | ORAL_TABLET | ORAL | Status: DC | PRN
  Administered 2016-10-07 – 2016-10-08 (×2): 650 mg via ORAL
  Filled 2016-10-04 (×2): qty 2

## 2016-10-04 MED ORDER — SIMETHICONE 80 MG PO CHEW
80.0000 mg | CHEWABLE_TABLET | ORAL | Status: DC
  Administered 2016-10-05 – 2016-10-07 (×4): 80 mg via ORAL
  Filled 2016-10-04 (×5): qty 1

## 2016-10-04 MED ORDER — PROMETHAZINE HCL 25 MG/ML IJ SOLN: 6.2500 mg | INTRAMUSCULAR | Status: DC | PRN

## 2016-10-04 MED ORDER — SODIUM CHLORIDE 0.9% FLUSH
3.0000 mL | INTRAVENOUS | Status: DC | PRN
Start: 2016-10-04 — End: 2016-10-08

## 2016-10-04 MED ORDER — OXYTOCIN 40 UNITS IN LACTATED RINGERS INFUSION - SIMPLE MED: 2.5000 [IU]/h | INTRAVENOUS | Status: DC

## 2016-10-04 MED ORDER — BUPIVACAINE IN DEXTROSE 0.75-8.25 % IT SOLN
INTRATHECAL | Status: AC
  Filled 2016-10-04: qty 2

## 2016-10-04 MED ORDER — ONDANSETRON HCL 4 MG/2ML IJ SOLN
4.0000 mg | Freq: Three times a day (TID) | INTRAMUSCULAR | Status: DC | PRN
  Administered 2016-10-04: 4 mg via INTRAVENOUS
  Filled 2016-10-04: qty 2

## 2016-10-04 MED ORDER — FENTANYL CITRATE (PF) 100 MCG/2ML IJ SOLN: 25.0000 ug | INTRAMUSCULAR | Status: DC | PRN

## 2016-10-04 MED ORDER — DIPHENHYDRAMINE HCL 25 MG PO CAPS
25.0000 mg | ORAL_CAPSULE | ORAL | Status: DC | PRN
  Administered 2016-10-04: 25 mg via ORAL
  Filled 2016-10-04: qty 1

## 2016-10-04 MED ORDER — LACTATED RINGERS IV SOLN
INTRAVENOUS | Status: DC | PRN
  Administered 2016-10-04: 09:00:00 via INTRAVENOUS

## 2016-10-04 MED ORDER — NALOXONE HCL 2 MG/2ML IJ SOSY
1.0000 ug/kg/h | PREFILLED_SYRINGE | INTRAVENOUS | Status: DC | PRN
  Filled 2016-10-04: qty 2

## 2016-10-04 MED ORDER — WITCH HAZEL-GLYCERIN EX PADS: 1.0000 "application " | MEDICATED_PAD | CUTANEOUS | Status: DC | PRN

## 2016-10-04 MED ORDER — SIMETHICONE 80 MG PO CHEW
80.0000 mg | CHEWABLE_TABLET | Freq: Three times a day (TID) | ORAL | Status: DC
  Administered 2016-10-04 – 2016-10-08 (×10): 80 mg via ORAL
  Filled 2016-10-04 (×10): qty 1

## 2016-10-04 MED ORDER — LACTATED RINGERS IV SOLN
INTRAVENOUS | Status: DC
  Administered 2016-10-04 (×2): via INTRAVENOUS

## 2016-10-04 MED ORDER — PRENATAL MULTIVITAMIN CH
1.0000 | ORAL_TABLET | Freq: Every day | ORAL | Status: DC
  Administered 2016-10-05 – 2016-10-07 (×3): 1 via ORAL
  Filled 2016-10-04 (×3): qty 1

## 2016-10-04 MED ORDER — TETANUS-DIPHTH-ACELL PERTUSSIS 5-2.5-18.5 LF-MCG/0.5 IM SUSP: 0.5000 mL | Freq: Once | INTRAMUSCULAR | Status: DC

## 2016-10-04 MED ORDER — MORPHINE SULFATE (PF) 0.5 MG/ML IJ SOLN
INTRAMUSCULAR | Status: AC
Start: 2016-10-04 — End: 2016-10-04
  Filled 2016-10-04: qty 10

## 2016-10-04 MED ORDER — NALOXONE HCL 0.4 MG/ML IJ SOLN: 0.4000 mg | INTRAMUSCULAR | Status: DC | PRN

## 2016-10-04 MED ORDER — SODIUM CHLORIDE 0.9 % IR SOLN
Status: DC | PRN
  Administered 2016-10-04: 1000 mL

## 2016-10-04 MED ORDER — MEPERIDINE HCL 25 MG/ML IJ SOLN: 6.2500 mg | INTRAMUSCULAR | Status: DC | PRN

## 2016-10-04 MED ORDER — DIPHENHYDRAMINE HCL 25 MG PO CAPS: 25.0000 mg | ORAL_CAPSULE | Freq: Four times a day (QID) | ORAL | Status: DC | PRN

## 2016-10-04 MED ORDER — OXYTOCIN 10 UNIT/ML IJ SOLN
INTRAVENOUS | Status: DC | PRN
  Administered 2016-10-04: 40 [IU] via INTRAVENOUS

## 2016-10-04 MED ORDER — COCONUT OIL OIL: 1.0000 "application " | TOPICAL_OIL | Status: DC | PRN

## 2016-10-04 MED ORDER — OXYCODONE-ACETAMINOPHEN 5-325 MG PO TABS: 2.0000 | ORAL_TABLET | ORAL | Status: DC | PRN

## 2016-10-04 MED ORDER — OXYCODONE-ACETAMINOPHEN 5-325 MG PO TABS
1.0000 | ORAL_TABLET | ORAL | Status: DC | PRN
  Administered 2016-10-05: 1 via ORAL
  Filled 2016-10-04: qty 1

## 2016-10-04 MED ORDER — PHENYLEPHRINE 8 MG IN D5W 100 ML (0.08MG/ML) PREMIX OPTIME
INJECTION | INTRAVENOUS | Status: DC | PRN
  Administered 2016-10-04: 60 ug/min via INTRAVENOUS

## 2016-10-04 MED ORDER — KETOROLAC TROMETHAMINE 30 MG/ML IJ SOLN
INTRAMUSCULAR | Status: AC
  Filled 2016-10-04: qty 1

## 2016-10-04 MED ORDER — LACTATED RINGERS IV SOLN
INTRAVENOUS | Status: DC
  Administered 2016-10-04: 125 mL/h via INTRAVENOUS

## 2016-10-04 SURGICAL SUPPLY — 35 items
ADH SKN CLS APL DERMABOND .7 (GAUZE/BANDAGES/DRESSINGS)
APL SKNCLS STERI-STRIP NONHPOA (GAUZE/BANDAGES/DRESSINGS) ×1
BENZOIN TINCTURE PRP APPL 2/3 (GAUZE/BANDAGES/DRESSINGS) ×3 IMPLANT
CHLORAPREP W/TINT 26ML (MISCELLANEOUS) ×3 IMPLANT
CLAMP CORD UMBIL (MISCELLANEOUS) IMPLANT
CLOSURE WOUND 1/2 X4 (GAUZE/BANDAGES/DRESSINGS) ×1
CLOTH BEACON ORANGE TIMEOUT ST (SAFETY) ×3 IMPLANT
DERMABOND ADVANCED (GAUZE/BANDAGES/DRESSINGS)
DERMABOND ADVANCED .7 DNX12 (GAUZE/BANDAGES/DRESSINGS) IMPLANT
DRSG OPSITE POSTOP 4X10 (GAUZE/BANDAGES/DRESSINGS) ×3 IMPLANT
ELECT REM PT RETURN 9FT ADLT (ELECTROSURGICAL) ×3
ELECTRODE REM PT RTRN 9FT ADLT (ELECTROSURGICAL) ×1 IMPLANT
EXTRACTOR VACUUM KIWI (MISCELLANEOUS) IMPLANT
GLOVE BIO SURGEON STRL SZ 6 (GLOVE) ×3 IMPLANT
GLOVE BIOGEL PI IND STRL 6 (GLOVE) ×2 IMPLANT
GLOVE BIOGEL PI IND STRL 7.0 (GLOVE) ×1 IMPLANT
GLOVE BIOGEL PI INDICATOR 6 (GLOVE) ×4
GLOVE BIOGEL PI INDICATOR 7.0 (GLOVE) ×2
GOWN STRL REUS W/TWL LRG LVL3 (GOWN DISPOSABLE) ×6 IMPLANT
KIT ABG SYR 3ML LUER SLIP (SYRINGE) ×3 IMPLANT
NDL HYPO 25X5/8 SAFETYGLIDE (NEEDLE) ×1 IMPLANT
NEEDLE HYPO 25X5/8 SAFETYGLIDE (NEEDLE) ×3 IMPLANT
NS IRRIG 1000ML POUR BTL (IV SOLUTION) ×3 IMPLANT
PACK C SECTION WH (CUSTOM PROCEDURE TRAY) ×3 IMPLANT
PAD OB MATERNITY 4.3X12.25 (PERSONAL CARE ITEMS) ×3 IMPLANT
PENCIL SMOKE EVAC W/HOLSTER (ELECTROSURGICAL) ×3 IMPLANT
STRIP CLOSURE SKIN 1/2X4 (GAUZE/BANDAGES/DRESSINGS) ×1 IMPLANT
SUT CHROMIC 0 CTX 36 (SUTURE) ×9 IMPLANT
SUT MON AB 2-0 CT1 27 (SUTURE) ×3 IMPLANT
SUT PDS AB 0 CT1 27 (SUTURE) IMPLANT
SUT PLAIN 0 NONE (SUTURE) IMPLANT
SUT VIC AB 0 CT1 36 (SUTURE) IMPLANT
SUT VIC AB 4-0 KS 27 (SUTURE) ×2 IMPLANT
TOWEL OR 17X24 6PK STRL BLUE (TOWEL DISPOSABLE) ×3 IMPLANT
TRAY FOLEY BAG SILVER LF 14FR (SET/KITS/TRAYS/PACK) IMPLANT

## 2016-10-04 NOTE — Anesthesia Preprocedure Evaluation (Signed)
Anesthesia Evaluation  Patient identified by MRN, date of birth, ID band Patient awake    Reviewed: Allergy & Precautions, NPO status , Patient's Chart, lab work & pertinent test results  Airway Mallampati: II  TM Distance: >3 FB Neck ROM: Full    Dental  (+) Dental Advisory Given   Pulmonary neg pulmonary ROS,    breath sounds clear to auscultation       Cardiovascular hypertension (Pre-E),  Rhythm:Regular Rate:Normal     Neuro/Psych negative neurological ROS     GI/Hepatic negative GI ROS, Neg liver ROS,   Endo/Other  negative endocrine ROS  Renal/GU negative Renal ROS     Musculoskeletal negative musculoskeletal ROS (+)   Abdominal   Peds  Hematology negative hematology ROS (+)   Anesthesia Other Findings   Reproductive/Obstetrics (+) Pregnancy                             Lab Results  Component Value Date   WBC 10.8 (H) 10/03/2016   HGB 12.9 10/03/2016   HCT 37.9 10/03/2016   MCV 89.2 10/03/2016   PLT 253 10/03/2016   Lab Results  Component Value Date   CREATININE 0.60 10/03/2016   BUN 14 10/03/2016   NA 133 (L) 10/03/2016   K 4.1 10/03/2016   CL 104 10/03/2016   CO2 20 (L) 10/03/2016    Anesthesia Physical Anesthesia Plan  ASA: II  Anesthesia Plan: Spinal   Post-op Pain Management:    Induction:   Airway Management Planned: Natural Airway  Additional Equipment:   Intra-op Plan:   Post-operative Plan:   Informed Consent: I have reviewed the patients History and Physical, chart, labs and discussed the procedure including the risks, benefits and alternatives for the proposed anesthesia with the patient or authorized representative who has indicated his/her understanding and acceptance.     Plan Discussed with: CRNA and Surgeon  Anesthesia Plan Comments:         Anesthesia Quick Evaluation

## 2016-10-04 NOTE — Progress Notes (Signed)
Dr Langston Masker notified of ^ blood pressures with orthostatics.  Pulse remains within normal range

## 2016-10-04 NOTE — Anesthesia Postprocedure Evaluation (Signed)
Anesthesia Post Note  Patient: Angel Quinn  Procedure(s) Performed: Procedure(s) (LRB): CESAREAN SECTION (N/A)  Patient location during evaluation: Mother Baby Anesthesia Type: Spinal Level of consciousness: awake and alert and oriented Pain management: pain level controlled Vital Signs Assessment: post-procedure vital signs reviewed and stable Respiratory status: spontaneous breathing and nonlabored ventilation Cardiovascular status: stable Postop Assessment: no headache, no backache, spinal receding, no signs of nausea or vomiting, patient able to bend at knees and adequate PO intake Anesthetic complications: no        Last Vitals:  Vitals:   10/04/16 1200 10/04/16 1300  BP: 133/73 134/81  Pulse: 74 74  Resp: 18 18  Temp: 36.4 C 36.6 C    Last Pain:  Vitals:   10/04/16 1300  TempSrc: Axillary  PainSc:    Pain Goal: Patients Stated Pain Goal: 4 (10/04/16 0822)               Laban Emperor

## 2016-10-04 NOTE — Anesthesia Postprocedure Evaluation (Signed)
Anesthesia Post Note  Patient: Angel Quinn  Procedure(s) Performed: Procedure(s) (LRB): CESAREAN SECTION (N/A)  Patient location during evaluation: PACU Anesthesia Type: Spinal Level of consciousness: awake and alert Pain management: pain level controlled Vital Signs Assessment: post-procedure vital signs reviewed and stable Respiratory status: spontaneous breathing and respiratory function stable Cardiovascular status: blood pressure returned to baseline and stable Postop Assessment: spinal receding Anesthetic complications: no        Last Vitals:  Vitals:   10/04/16 1036 10/04/16 1042  BP:    Pulse: 83   Resp: 17 17  Temp: 36.5 C     Last Pain:  Vitals:   10/04/16 0822  TempSrc: Oral   Pain Goal: Patients Stated Pain Goal: 4 (10/04/16 0822)               Kennieth Rad

## 2016-10-04 NOTE — Anesthesia Procedure Notes (Signed)
Spinal  Start time: 10/04/2016 8:40 AM End time: 10/04/2016 8:45 AM Staffing Anesthesiologist: Marcene Duos Performed: anesthesiologist  Preanesthetic Checklist Completed: patient identified, site marked, surgical consent, pre-op evaluation, timeout performed, IV checked, risks and benefits discussed and monitors and equipment checked Spinal Block Patient position: sitting Prep: site prepped and draped and DuraPrep Patient monitoring: blood pressure, continuous pulse ox and heart rate Approach: midline Location: L4-5 Injection technique: single-shot Needle Needle type: Pencan  Needle gauge: 24 G Needle length: 9 cm Needle insertion depth: 6 cm Assessment Sensory level: T4

## 2016-10-04 NOTE — Transfer of Care (Signed)
Immediate Anesthesia Transfer of Care Note  Patient: Angel Quinn  Procedure(s) Performed: Procedure(s) with comments: CESAREAN SECTION (N/A) - Tracey to RNFA  Patient Location: PACU  Anesthesia Type:Spinal  Level of Consciousness: awake, alert  and oriented  Airway & Oxygen Therapy: Patient Spontanous Breathing  Post-op Assessment: Report given to RN and Post -op Vital signs reviewed and stable  Post vital signs: Reviewed and stable  Last Vitals:  Vitals:   10/04/16 0822 10/04/16 0943  Pulse:  89  Resp: 18   Temp: 36.9 C     Last Pain:  Vitals:   10/04/16 0822  TempSrc: Oral      Patients Stated Pain Goal: 4 (10/04/16 1610)  Complications: No apparent anesthesia complications

## 2016-10-04 NOTE — Addendum Note (Signed)
Addendum  created 10/04/16 1354 by Drezden Seitzinger H Karlin Binion, CRNA   Sign clinical note    

## 2016-10-04 NOTE — Op Note (Signed)
Angel Quinn PROCEDURE DATE: 10/04/2016  PREOPERATIVE DIAGNOSIS: Intrauterine pregnancy at  [redacted]w[redacted]d weeks gestation, pre-eclampsia without severe features, elective cesarean  POSTOPERATIVE DIAGNOSIS: The same  PROCEDURE:    Low Transverse Cesarean Section  SURGEON:  Dr. Mitchel Honour  ASSISTANT: Mamie Levers, RNFA  INDICATIONS: Angel Quinn is a 36 y.o. G1P0 at [redacted]w[redacted]d scheduled for cesarean section secondary pre-eclampsia, patient request for elective C/S.  The risks of cesarean section discussed with the patient included but were not limited to: bleeding which may require transfusion or reoperation; infection which may require antibiotics; injury to bowel, bladder, ureters or other surrounding organs; injury to the fetus; need for additional procedures including hysterectomy in the event of a life-threatening hemorrhage; placental abnormalities wth subsequent pregnancies, incisional problems, thromboembolic phenomenon and other postoperative/anesthesia complications. The patient concurred with the proposed plan, giving informed written consent for the procedure.    FINDINGS:  Viable female infant in cephalic presentation, APGARs :  Weight pending Clear amniotic fluid.  Intact placenta, three vessel cord.  Grossly normal uterus, ovaries and fallopian tubes. .   ANESTHESIA:  Spinal ESTIMATED BLOOD LOSS: 700 ml SPECIMENS: Placenta sent to L&D COMPLICATIONS: None immediate  PROCEDURE IN DETAIL:  The patient received intravenous antibiotics and had sequential compression devices applied to her lower extremities while in the preoperative area.  She was then taken to the operating room where spinal anesthesia was administered and was found to be adequate. She was then placed in a dorsal supine position with a leftward tilt, and prepped and draped in a sterile manner.  A foley catheter was placed into her bladder and attached to constant gravity.  After an adequate timeout was performed, a Pfannenstiel  skin incision was made with scalpel and carried through to the underlying layer of fascia. The fascia was incised in the midline and this incision was extended bilaterally using the Mayo scissors. Kocher clamps were applied to the superior aspect of the fascial incision and the underlying rectus muscles were dissected off bluntly. A similar process was carried out on the inferior aspect of the facial incision. The rectus muscles were separated in the midline bluntly and the peritoneum was entered bluntly.   A transverse hysterotomy was made with a scalpel and extended bilaterally bluntly. The bladder blade was then removed. The infant was successfully delivered, and cord was clamped and cut and infant was handed over to awaiting neonatology team. Uterine massage was then administered and the placenta delivered intact with three-vessel cord. The uterus was cleared of clot and debris.  The hysterotomy was closed with 0 chromic.  A second imbricating suture of 0-chromic was used to reinforce the incision and aid in hemostasis.  The peritoneum and rectus muscles were noted to be hemostatic and was reapproximated using 3-0 monocryl in a running fashion.  The fascia was closed with 0-Vicryl in a running fashion with good restoration of anatomy.  The subcutaneus tissue was copiously irrigated.  The skin was closed with 4-0 vicryl in a subcuticular fashion.  Pt tolerated the procedure will.  All counts were correct x2.  Pt went to the recovery room in stable condition.

## 2016-10-04 NOTE — Progress Notes (Signed)
No change to H&P.  Laverne Klugh, DO 

## 2016-10-05 LAB — BIRTH TISSUE RECOVERY COLLECTION (PLACENTA DONATION)

## 2016-10-05 LAB — COMPREHENSIVE METABOLIC PANEL
ALT: 16 U/L (ref 14–54)
ANION GAP: 6 (ref 5–15)
AST: 21 U/L (ref 15–41)
Albumin: 2.4 g/dL — ABNORMAL LOW (ref 3.5–5.0)
Alkaline Phosphatase: 86 U/L (ref 38–126)
BUN: 14 mg/dL (ref 6–20)
CHLORIDE: 106 mmol/L (ref 101–111)
CO2: 24 mmol/L (ref 22–32)
Calcium: 8.7 mg/dL — ABNORMAL LOW (ref 8.9–10.3)
Creatinine, Ser: 0.72 mg/dL (ref 0.44–1.00)
GFR calc Af Amer: >60 mL/min (ref >60)
Glucose, Bld: 108 mg/dL — ABNORMAL HIGH (ref 65–99)
POTASSIUM: 3.8 mmol/L (ref 3.5–5.1)
Sodium: 136 mmol/L (ref 135–145)
Total Bilirubin: 0.3 mg/dL (ref 0.3–1.2)
Total Protein: 5.7 g/dL — ABNORMAL LOW (ref 6.5–8.1)

## 2016-10-05 LAB — CBC
HCT: 31.8 % — ABNORMAL LOW (ref 36.0–46.0)
Hemoglobin: 10.8 g/dL — ABNORMAL LOW (ref 12.0–15.0)
MCH: 30.5 pg (ref 26.0–34.0)
MCHC: 34 g/dL (ref 30.0–36.0)
MCV: 89.8 fL (ref 78.0–100.0)
PLATELETS: 207 10*3/uL (ref 150–400)
RBC: 3.54 MIL/uL — ABNORMAL LOW (ref 3.87–5.11)
RDW: 15.3 % (ref 11.5–15.5)
WBC: 10.7 10*3/uL — AB (ref 4.0–10.5)

## 2016-10-05 NOTE — Progress Notes (Signed)
Subjective: Postpartum Day 1: Cesarean Delivery Patient reports tolerating PO and no problems voiding.  No HA, CP/SOB, RUQ pain or visual disturbance.  Baby in NICU for resp support.  Objective: Vital signs in last 24 hours: Temp:  [97.6 F (36.4 C)-98.7 F (37.1 C)] 98.1 F (36.7 C) (04/07 0615) Pulse Rate:  [73-93] 78 (04/07 0615) Resp:  [16-27] 18 (04/07 0615) BP: (123-141)/(65-90) 123/75 (04/07 0615) SpO2:  [97 %-100 %] 99 % (04/07 0615)  Physical Exam:  General: alert, cooperative and appears stated age Lochia: appropriate Uterine Fundus: firm Incision: healing well, no significant drainage, no dehiscence DVT Evaluation: No evidence of DVT seen on physical exam. Negative Homan's sign. No cords or calf tenderness.   Recent Labs  10/04/16 0720 10/05/16 0529  HGB 12.8 10.8*  HCT 38.3 31.8*    Assessment/Plan: Status post Cesarean section. Doing well postoperatively.  Continue current care. Pre-eclampsia-BPs in normal range.  No medication.  Pre-eclampsia labs wnl.  Will monitor.  Marilu Rylander 10/05/2016, 9:15 AM

## 2016-10-05 NOTE — Progress Notes (Signed)
CSW acknowledges NICU admission. CSW met with MOB at bedside to complete assessment and offer support as needed. MOB denied the need for anything at this time. CSW inquired about hx of anxiety/depression. MOB noted it is managed and doe snot need any referrals at this time.   Blaiden Werth, MSW, LCSW-A Clinical Social Worker  [facility]  Office: [phone]     

## 2016-10-06 NOTE — Progress Notes (Signed)
Subjective: Postpartum Day 2: Cesarean Delivery Patient reports tolerating PO, + flatus and no problems voiding.  No HA, CP/SOB, RUQ pain or visual disturbance.  Baby in NICU for respiratory support.  Objective: Vital signs in last 24 hours: Temp:  [98.5 F (36.9 C)] 98.5 F (36.9 C) (04/07 1739) Pulse Rate:  [90] 90 (04/07 1739) Resp:  [19] 19 (04/07 1739) BP: (147-153)/(80-85) 147/80 (04/07 1826)  Physical Exam:  General: alert, cooperative and appears stated age Lochia: appropriate Uterine Fundus: firm Incision: healing well, no significant drainage, no dehiscence DVT Evaluation: No evidence of DVT seen on physical exam. Negative Homan's sign. No cords or calf tenderness.   Recent Labs  10/04/16 0720 10/05/16 0529  HGB 12.8 10.8*  HCT 38.3 31.8*    Assessment/Plan: Status post Cesarean section. Doing well postoperatively.  Continue current care. Pre-eclampsia-BP stable on no meds.  Continue to monitor.  Angel Quinn 10/06/2016, 10:01 AM

## 2016-10-07 ENCOUNTER — Encounter (HOSPITAL_COMMUNITY): Payer: Self-pay | Admitting: *Deleted

## 2016-10-07 MED ORDER — IBUPROFEN 600 MG PO TABS: 600.0000 mg | ORAL_TABLET | Freq: Four times a day (QID) | ORAL | 1 refills | Status: DC

## 2016-10-07 MED ORDER — BUPROPION HCL ER (XL) 150 MG PO TB24
150.0000 mg | ORAL_TABLET | Freq: Every day | ORAL | Status: DC
  Administered 2016-10-07 – 2016-10-08 (×2): 150 mg via ORAL
  Filled 2016-10-07 (×2): qty 1

## 2016-10-07 MED ORDER — FLUOXETINE HCL 20 MG PO CAPS
20.0000 mg | ORAL_CAPSULE | Freq: Every day | ORAL | Status: DC
  Administered 2016-10-07 – 2016-10-08 (×2): 20 mg via ORAL
  Filled 2016-10-07 (×2): qty 1

## 2016-10-07 NOTE — Progress Notes (Addendum)
Subjective: Postpartum Day 3: Cesarean Delivery Patient reports incisional pain, tolerating PO and no problems voiding.  Baby in NICU.  Denies Severe PIH sxs  Objective: Vital signs in last 24 hours: Temp:  [97.5 F (36.4 C)-98.1 F (36.7 C)] 98.1 F (36.7 C) (04/09 0500) Pulse Rate:  [84-95] 84 (04/09 0500) Resp:  [20] 20 (04/09 0500) BP: (135-145)/(68-84) 135/68 (04/09 0500)  Physical Exam:  General: alert, cooperative, appears stated age and mild distress Lochia: appropriate Uterine Fundus: firm Incision: healing well DVT Evaluation: No evidence of DVT seen on physical exam.   Recent Labs  10/05/16 0529  HGB 10.8*  HCT 31.8*    Assessment/Plan: Status post Cesarean section. Doing well postoperatively.  Continue current care Baby in NICU.  Plan d/c tomorrow. Pt wants to restart her home meds - prozac and wellbutrin Kentaro Alewine C 10/07/2016, 9:18 AM

## 2016-10-07 NOTE — Discharge Summary (Signed)
Obstetric Discharge Summary Reason for Admission: cesarean section Prenatal Procedures: ultrasound Intrapartum Procedures: cesarean: low cervical, transverse Postpartum Procedures: none Complications-Operative and Postpartum: none Hemoglobin  Date Value Ref Range Status  10/05/2016 10.8 (L) 12.0 - 15.0 g/dL Final  40/98/1191 47.8 g/dL Final   HCT  Date Value Ref Range Status  10/05/2016 31.8 (L) 36.0 - 46.0 % Final  03/26/2016 39 % Final    Physical Exam:  General: alert and cooperative and tearful Lochia: appropriate Uterine Fundus: firm Incision: healing well DVT Evaluation: No evidence of DVT seen on physical exam. Negative Homan's sign. No cords or calf tenderness. Calf/Ankle edema is present.  Discharge Diagnoses: Term Pregnancy-delivered and Preelampsia  Discharge Information: Date: 10/07/2016 Activity: pelvic rest Diet: routine Medications: PNV and Ibuprofen. Discussed starting Zoloft . Condition: stable Instructions: refer to practice specific booklet Discharge to: home   Newborn Data: Live born female  Birth Weight:   APGAR: 8, 9  Baby in NICU  CURTIS,CAROL G 10/07/2016, 8:09 AM

## 2016-10-08 MED ORDER — BUPROPION HCL ER (XL) 150 MG PO TB24: 150.0000 mg | ORAL_TABLET | Freq: Every day | ORAL | 1 refills | Status: DC

## 2016-10-08 MED ORDER — ZOLPIDEM TARTRATE 5 MG PO TABS: 5.0000 mg | ORAL_TABLET | Freq: Every evening | ORAL | 0 refills | Status: DC | PRN

## 2016-10-08 MED ORDER — FLUOXETINE HCL 20 MG PO CAPS: 20.0000 mg | ORAL_CAPSULE | Freq: Every day | ORAL | 1 refills | Status: DC

## 2016-10-08 NOTE — Lactation Note (Signed)
This note was copied from a baby's chart. Lactation Consultation Note  Patient Name: Girl Kiala Faraj [id number] Date: 10/08/2016 Reason for consult: Initial assessment;NICU baby    With this mom of an early term baby, Now 53 hours old, and in NICU, . Mom asked to see lactation, and I saw her at baby's bedside. Both parents were there, dad was feeding the baby a bottle.Mom is enngorged, and was asking for advice.   When I suggested mom begin with pumping with DEP, mom stated she did not want to pump or breast feed, and the only reason she has done so, is because dad wanted her to. Dad said it is not his decision,it is about her and her baby. I told mom EBM was better for her baby, but if she does not want to pump, it si her body, her baby, and her decision. Wheat is best for her will be best for her baby. I reviewed with mom to wear a comfortable bra, use ice and take her motrin around the clock. Mom is restarting Prozac and Welbutrin, which are both compatible with breast feeding, and I told mom this,, but again, it is her choice. She was upset that she would have to pump every 3 hours, and clearly, this was not something she wanted to do. I told the parents to have further discussion about this, and if theh need me, to have Verlon Au, their NICu RN, to call me to come back. I brought mom ice packs to use.    Maternal Data    Feeding Feeding Type: Formula Nipple Type: Slow - flow Length of feed: 30 min  LATCH Score/Interventions          Comfort (Breast/Nipple): Engorged, cracked, bleeding, large blisters, severe discomfort Problem noted: Engorgment Intervention(s): Ice  Problem noted: Mild/Moderate discomfort;Filling Interventions (Filling): Hand pump;Double electric pump Interventions (Mild/moderate discomfort): Reverse pressue        Lactation Tools Discussed/Used     Consult Status Consult Status: Complete Follow-up type: Call as needed    Alfred Levins 10/08/2016,  8:31 AM

## 2016-10-08 NOTE — Progress Notes (Signed)
Subjective: Postpartum Day 4: Cesarean Delivery Patient reports tolerating PO, + flatus and no problems voiding.    Objective: Vital signs in last 24 hours: Temp:  [97.6 F (36.4 C)-97.7 F (36.5 C)] 97.6 F (36.4 C) (04/10 0558) Pulse Rate:  [82-98] 82 (04/10 0558) Resp:  [20] 20 (04/10 0558) BP: (147-162)/(83-92) 152/83 (04/10 4540)  Physical Exam:  General: alert and cooperative Lochia: appropriate Uterine Fundus: firm Incision: healing well DVT Evaluation: No evidence of DVT seen on physical exam. Negative Homan's sign. No cords or calf tenderness. Calf/Ankle edema is present.  No results for input(s): HGB, HCT in the last 72 hours.  Assessment/Plan: Status post Cesarean section. Doing well postoperatively.  Discharge home with standard precautions and return to clinic in 1-2 weeks.  Kaetlyn Noa G 10/08/2016, 8:11 AM

## 2016-10-08 NOTE — Progress Notes (Addendum)
RN called Dr. Rana Snare regarding patient's blood pressure of 162/90. Also RN informed Doctor that patient stated that she sometimes has floaters. Orders were given to take BP again in 1 hour. Parameters of 160/105 or greater were given for calling the doctor.

## 2016-11-12 LAB — HM PAP SMEAR

## 2016-11-14 DIAGNOSIS — Z1389 Encounter for screening for other disorder: Secondary | ICD-10-CM | POA: Diagnosis not present

## 2016-11-14 MED FILL — LARIN FE 1.5-30 TABLET: 1.5-30 | 28 days supply | Qty: 28 | Fill #0

## 2016-11-14 MED FILL — BUPROPION HCL XL 300 MG TAB: 300 | 30 days supply | Qty: 30 | Fill #0

## 2016-12-26 MED FILL — BUPROPION HCL XL 300 MG TAB: 300 | 30 days supply | Qty: 30 | Fill #1

## 2017-01-07 DIAGNOSIS — H5213 Myopia, bilateral: Secondary | ICD-10-CM | POA: Diagnosis not present

## 2017-02-04 MED FILL — ADDERALL XR 20 MG CAP SA: 20 | 30 days supply | Qty: 60 | Fill #0

## 2017-02-19 DIAGNOSIS — M9901 Segmental and somatic dysfunction of cervical region: Secondary | ICD-10-CM | POA: Diagnosis not present

## 2017-02-19 DIAGNOSIS — G540 Brachial plexus disorders: Secondary | ICD-10-CM | POA: Diagnosis not present

## 2017-02-19 DIAGNOSIS — M609 Myositis, unspecified: Secondary | ICD-10-CM | POA: Diagnosis not present

## 2017-02-19 DIAGNOSIS — M9902 Segmental and somatic dysfunction of thoracic region: Secondary | ICD-10-CM | POA: Diagnosis not present

## 2017-02-19 DIAGNOSIS — R202 Paresthesia of skin: Secondary | ICD-10-CM | POA: Diagnosis not present

## 2017-02-24 DIAGNOSIS — R202 Paresthesia of skin: Secondary | ICD-10-CM | POA: Diagnosis not present

## 2017-02-24 DIAGNOSIS — G540 Brachial plexus disorders: Secondary | ICD-10-CM | POA: Diagnosis not present

## 2017-02-24 DIAGNOSIS — M9902 Segmental and somatic dysfunction of thoracic region: Secondary | ICD-10-CM | POA: Diagnosis not present

## 2017-02-24 DIAGNOSIS — M9901 Segmental and somatic dysfunction of cervical region: Secondary | ICD-10-CM | POA: Diagnosis not present

## 2017-02-24 DIAGNOSIS — M609 Myositis, unspecified: Secondary | ICD-10-CM | POA: Diagnosis not present

## 2017-03-05 DIAGNOSIS — R202 Paresthesia of skin: Secondary | ICD-10-CM | POA: Diagnosis not present

## 2017-03-05 DIAGNOSIS — G540 Brachial plexus disorders: Secondary | ICD-10-CM | POA: Diagnosis not present

## 2017-03-05 DIAGNOSIS — M609 Myositis, unspecified: Secondary | ICD-10-CM | POA: Diagnosis not present

## 2017-03-05 DIAGNOSIS — M9902 Segmental and somatic dysfunction of thoracic region: Secondary | ICD-10-CM | POA: Diagnosis not present

## 2017-03-05 DIAGNOSIS — M9901 Segmental and somatic dysfunction of cervical region: Secondary | ICD-10-CM | POA: Diagnosis not present

## 2017-03-13 DIAGNOSIS — G540 Brachial plexus disorders: Secondary | ICD-10-CM | POA: Diagnosis not present

## 2017-03-13 DIAGNOSIS — M609 Myositis, unspecified: Secondary | ICD-10-CM | POA: Diagnosis not present

## 2017-03-13 DIAGNOSIS — M9902 Segmental and somatic dysfunction of thoracic region: Secondary | ICD-10-CM | POA: Diagnosis not present

## 2017-03-13 DIAGNOSIS — M9901 Segmental and somatic dysfunction of cervical region: Secondary | ICD-10-CM | POA: Diagnosis not present

## 2017-03-13 DIAGNOSIS — R202 Paresthesia of skin: Secondary | ICD-10-CM | POA: Diagnosis not present

## 2017-03-19 DIAGNOSIS — M609 Myositis, unspecified: Secondary | ICD-10-CM | POA: Diagnosis not present

## 2017-03-19 DIAGNOSIS — M9901 Segmental and somatic dysfunction of cervical region: Secondary | ICD-10-CM | POA: Diagnosis not present

## 2017-03-19 DIAGNOSIS — M9902 Segmental and somatic dysfunction of thoracic region: Secondary | ICD-10-CM | POA: Diagnosis not present

## 2017-03-19 DIAGNOSIS — G540 Brachial plexus disorders: Secondary | ICD-10-CM | POA: Diagnosis not present

## 2017-03-19 DIAGNOSIS — R202 Paresthesia of skin: Secondary | ICD-10-CM | POA: Diagnosis not present

## 2017-03-20 DIAGNOSIS — M9901 Segmental and somatic dysfunction of cervical region: Secondary | ICD-10-CM | POA: Diagnosis not present

## 2017-03-20 DIAGNOSIS — M609 Myositis, unspecified: Secondary | ICD-10-CM | POA: Diagnosis not present

## 2017-03-20 DIAGNOSIS — R202 Paresthesia of skin: Secondary | ICD-10-CM | POA: Diagnosis not present

## 2017-03-20 DIAGNOSIS — M9902 Segmental and somatic dysfunction of thoracic region: Secondary | ICD-10-CM | POA: Diagnosis not present

## 2017-03-20 DIAGNOSIS — G540 Brachial plexus disorders: Secondary | ICD-10-CM | POA: Diagnosis not present

## 2017-03-24 DIAGNOSIS — M9902 Segmental and somatic dysfunction of thoracic region: Secondary | ICD-10-CM | POA: Diagnosis not present

## 2017-03-24 DIAGNOSIS — G540 Brachial plexus disorders: Secondary | ICD-10-CM | POA: Diagnosis not present

## 2017-03-24 DIAGNOSIS — M9901 Segmental and somatic dysfunction of cervical region: Secondary | ICD-10-CM | POA: Diagnosis not present

## 2017-03-24 DIAGNOSIS — R202 Paresthesia of skin: Secondary | ICD-10-CM | POA: Diagnosis not present

## 2017-03-24 DIAGNOSIS — M609 Myositis, unspecified: Secondary | ICD-10-CM | POA: Diagnosis not present

## 2017-03-25 ENCOUNTER — Other Ambulatory Visit: Payer: Self-pay | Admitting: Chiropractic Medicine

## 2017-03-25 DIAGNOSIS — Z9889 Other specified postprocedural states: Secondary | ICD-10-CM

## 2017-03-26 DIAGNOSIS — M609 Myositis, unspecified: Secondary | ICD-10-CM | POA: Diagnosis not present

## 2017-03-26 DIAGNOSIS — G540 Brachial plexus disorders: Secondary | ICD-10-CM | POA: Diagnosis not present

## 2017-03-26 DIAGNOSIS — M9901 Segmental and somatic dysfunction of cervical region: Secondary | ICD-10-CM | POA: Diagnosis not present

## 2017-03-26 DIAGNOSIS — M9902 Segmental and somatic dysfunction of thoracic region: Secondary | ICD-10-CM | POA: Diagnosis not present

## 2017-03-26 DIAGNOSIS — R202 Paresthesia of skin: Secondary | ICD-10-CM | POA: Diagnosis not present

## 2017-03-31 DIAGNOSIS — G540 Brachial plexus disorders: Secondary | ICD-10-CM | POA: Diagnosis not present

## 2017-03-31 DIAGNOSIS — M9901 Segmental and somatic dysfunction of cervical region: Secondary | ICD-10-CM | POA: Diagnosis not present

## 2017-03-31 DIAGNOSIS — R202 Paresthesia of skin: Secondary | ICD-10-CM | POA: Diagnosis not present

## 2017-03-31 DIAGNOSIS — M9902 Segmental and somatic dysfunction of thoracic region: Secondary | ICD-10-CM | POA: Diagnosis not present

## 2017-03-31 DIAGNOSIS — M609 Myositis, unspecified: Secondary | ICD-10-CM | POA: Diagnosis not present

## 2017-03-31 MED FILL — ADDERALL XR 20 MG CAP SA: 20 | 30 days supply | Qty: 60 | Fill #0

## 2017-04-02 DIAGNOSIS — M9902 Segmental and somatic dysfunction of thoracic region: Secondary | ICD-10-CM | POA: Diagnosis not present

## 2017-04-02 DIAGNOSIS — G540 Brachial plexus disorders: Secondary | ICD-10-CM | POA: Diagnosis not present

## 2017-04-02 DIAGNOSIS — M9901 Segmental and somatic dysfunction of cervical region: Secondary | ICD-10-CM | POA: Diagnosis not present

## 2017-04-02 DIAGNOSIS — R202 Paresthesia of skin: Secondary | ICD-10-CM | POA: Diagnosis not present

## 2017-04-02 DIAGNOSIS — M609 Myositis, unspecified: Secondary | ICD-10-CM | POA: Diagnosis not present

## 2017-04-03 ENCOUNTER — Ambulatory Visit
Admission: RE | Admit: 2017-04-03 | Discharge: 2017-04-03 | Disposition: A | Discharge status: Home / Self Care | Payer: 59 | Source: Ambulatory Visit | Attending: Chiropractic Medicine | Admitting: Chiropractic Medicine

## 2017-04-03 DIAGNOSIS — Z9889 Other specified postprocedural states: Secondary | ICD-10-CM

## 2017-04-03 DIAGNOSIS — M5126 Other intervertebral disc displacement, lumbar region: Secondary | ICD-10-CM | POA: Diagnosis not present

## 2017-04-07 DIAGNOSIS — M9902 Segmental and somatic dysfunction of thoracic region: Secondary | ICD-10-CM | POA: Diagnosis not present

## 2017-04-07 DIAGNOSIS — R202 Paresthesia of skin: Secondary | ICD-10-CM | POA: Diagnosis not present

## 2017-04-07 DIAGNOSIS — M609 Myositis, unspecified: Secondary | ICD-10-CM | POA: Diagnosis not present

## 2017-04-07 DIAGNOSIS — G540 Brachial plexus disorders: Secondary | ICD-10-CM | POA: Diagnosis not present

## 2017-04-07 DIAGNOSIS — M9901 Segmental and somatic dysfunction of cervical region: Secondary | ICD-10-CM | POA: Diagnosis not present

## 2017-04-08 ENCOUNTER — Other Ambulatory Visit: Payer: 59

## 2017-04-09 DIAGNOSIS — G540 Brachial plexus disorders: Secondary | ICD-10-CM | POA: Diagnosis not present

## 2017-04-09 DIAGNOSIS — M609 Myositis, unspecified: Secondary | ICD-10-CM | POA: Diagnosis not present

## 2017-04-09 DIAGNOSIS — R202 Paresthesia of skin: Secondary | ICD-10-CM | POA: Diagnosis not present

## 2017-04-09 DIAGNOSIS — M9901 Segmental and somatic dysfunction of cervical region: Secondary | ICD-10-CM | POA: Diagnosis not present

## 2017-04-09 DIAGNOSIS — M9902 Segmental and somatic dysfunction of thoracic region: Secondary | ICD-10-CM | POA: Diagnosis not present

## 2017-04-14 DIAGNOSIS — M9902 Segmental and somatic dysfunction of thoracic region: Secondary | ICD-10-CM | POA: Diagnosis not present

## 2017-04-14 DIAGNOSIS — R202 Paresthesia of skin: Secondary | ICD-10-CM | POA: Diagnosis not present

## 2017-04-14 DIAGNOSIS — G540 Brachial plexus disorders: Secondary | ICD-10-CM | POA: Diagnosis not present

## 2017-04-14 DIAGNOSIS — M609 Myositis, unspecified: Secondary | ICD-10-CM | POA: Diagnosis not present

## 2017-04-14 DIAGNOSIS — M9901 Segmental and somatic dysfunction of cervical region: Secondary | ICD-10-CM | POA: Diagnosis not present

## 2017-04-16 DIAGNOSIS — M9902 Segmental and somatic dysfunction of thoracic region: Secondary | ICD-10-CM | POA: Diagnosis not present

## 2017-04-16 DIAGNOSIS — M609 Myositis, unspecified: Secondary | ICD-10-CM | POA: Diagnosis not present

## 2017-04-16 DIAGNOSIS — R202 Paresthesia of skin: Secondary | ICD-10-CM | POA: Diagnosis not present

## 2017-04-16 DIAGNOSIS — G540 Brachial plexus disorders: Secondary | ICD-10-CM | POA: Diagnosis not present

## 2017-04-16 DIAGNOSIS — M9901 Segmental and somatic dysfunction of cervical region: Secondary | ICD-10-CM | POA: Diagnosis not present

## 2017-04-17 DIAGNOSIS — R030 Elevated blood-pressure reading, without diagnosis of hypertension: Secondary | ICD-10-CM | POA: Diagnosis not present

## 2017-04-17 DIAGNOSIS — Z6831 Body mass index (BMI) 31.0-31.9, adult: Secondary | ICD-10-CM | POA: Diagnosis not present

## 2017-04-17 DIAGNOSIS — M5126 Other intervertebral disc displacement, lumbar region: Secondary | ICD-10-CM | POA: Diagnosis not present

## 2017-04-17 DIAGNOSIS — M5416 Radiculopathy, lumbar region: Secondary | ICD-10-CM | POA: Diagnosis not present

## 2017-04-17 MED FILL — METHOCARBAMOL 500 MG TABS: 500 | 10 days supply | Qty: 40 | Fill #0

## 2017-04-17 MED FILL — OXYCODONE W/APAP 5/325 TAB: 5-325 | 15 days supply | Qty: 60 | Fill #0

## 2017-04-18 DIAGNOSIS — M5116 Intervertebral disc disorders with radiculopathy, lumbar region: Secondary | ICD-10-CM | POA: Diagnosis not present

## 2017-04-18 DIAGNOSIS — M5126 Other intervertebral disc displacement, lumbar region: Secondary | ICD-10-CM | POA: Diagnosis not present

## 2017-04-18 DIAGNOSIS — Z9889 Other specified postprocedural states: Secondary | ICD-10-CM | POA: Diagnosis not present

## 2017-04-21 MED FILL — diazePAM 5 MG TABS: 5 | 10 days supply | Qty: 40 | Fill #0

## 2017-05-21 MED FILL — ADDERALL XR 20 MG CAP SA: 20 | 30 days supply | Qty: 60 | Fill #0

## 2017-06-25 MED FILL — BUPROPION HCL XL 300 MG TAB: 300 | 30 days supply | Qty: 30 | Fill #2

## 2017-06-25 MED FILL — ADDERALL XR 20 MG CAP SA: 20 | 30 days supply | Qty: 60 | Fill #0

## 2017-06-25 MED FILL — OXYCODONE-ACETAMINOPHEN 5-3: 5-325 | 5 days supply | Qty: 40 | Fill #0

## 2017-07-02 ENCOUNTER — Other Ambulatory Visit (INDEPENDENT_AMBULATORY_CARE_PROVIDER_SITE_OTHER): Payer: 59

## 2017-07-02 ENCOUNTER — Ambulatory Visit (INDEPENDENT_AMBULATORY_CARE_PROVIDER_SITE_OTHER): Payer: 59 | Admitting: Internal Medicine

## 2017-07-02 ENCOUNTER — Encounter: Payer: Self-pay | Admitting: Internal Medicine

## 2017-07-02 VITALS — BP 138/100 | HR 90 | Temp 98.1°F | Ht 67.0 in | Wt 197.5 lb

## 2017-07-02 DIAGNOSIS — I10 Essential (primary) hypertension: Secondary | ICD-10-CM | POA: Insufficient documentation

## 2017-07-02 DIAGNOSIS — K5904 Chronic idiopathic constipation: Secondary | ICD-10-CM | POA: Insufficient documentation

## 2017-07-02 DIAGNOSIS — R739 Hyperglycemia, unspecified: Secondary | ICD-10-CM

## 2017-07-02 DIAGNOSIS — Z0000 Encounter for general adult medical examination without abnormal findings: Secondary | ICD-10-CM

## 2017-07-02 DIAGNOSIS — D539 Nutritional anemia, unspecified: Secondary | ICD-10-CM | POA: Diagnosis not present

## 2017-07-02 LAB — HEMOGLOBIN A1C: HEMOGLOBIN A1C: 5.4 % (ref 4.6–6.5)

## 2017-07-02 LAB — URINALYSIS, ROUTINE W REFLEX MICROSCOPIC
Bilirubin Urine: NEGATIVE
Hgb urine dipstick: NEGATIVE
Ketones, ur: NEGATIVE
Leukocytes, UA: NEGATIVE
Nitrite: NEGATIVE
RBC / HPF: NONE SEEN (ref 0–?)
Total Protein, Urine: NEGATIVE
URINE GLUCOSE: NEGATIVE
Urobilinogen, UA: 0.2 (ref 0.0–1.0)
pH: 6.5 (ref 5.0–8.0)

## 2017-07-02 LAB — LDL CHOLESTEROL, DIRECT: Direct LDL: 90 mg/dL

## 2017-07-02 LAB — LIPID PANEL
CHOL/HDL RATIO: 3
CHOLESTEROL: 244 mg/dL — AB (ref 0–200)
HDL: 76.6 mg/dL (ref >39.00)
NonHDL: 167.48
TRIGLYCERIDES: 321 mg/dL — AB (ref 0.0–149.0)
VLDL: 64.2 mg/dL — AB (ref 0.0–40.0)

## 2017-07-02 MED ORDER — LINACLOTIDE 72 MCG PO CAPS: 72.0000 ug | ORAL_CAPSULE | Freq: Every day | ORAL | 1 refills | Status: DC

## 2017-07-02 NOTE — Patient Instructions (Signed)

## 2017-07-02 NOTE — Progress Notes (Signed)
Subjective:  Patient ID: Angel Quinn, female    DOB: 05/02/1981  Age: 37 y.o. MRN: [id number]  CC: Annual Exam; Anemia; and Hypertension  NEW TO ME  HPI Angel Quinn presents for a CPX - 37 year old [place] female.  She has a history of hypertension but has decided not to treat it.  She is trying to control this with lifestyle modifications.  She complains of fatigue and excessive thirst but she denies headache, blurred vision, chest pain, shortness of breath, edema, or DOE.  She complains of chronic idiopathic constipation.  She tells me she only has a bowel movement about once or twice a week.  She uses MiraLAX and fiber as a rescue when she becomes uncomfortable from the constipation.  History Angel Quinn has a past medical history of Pregnancy induced hypertension.   She has a past surgical history that includes Back surgery; Wisdom tooth extraction; and Cesarean section (N/A, 10/04/2016).   Her family history includes Aortic stenosis in her father; Diabetes in her father, mother, and sister; Hyperlipidemia in her father and mother; Hypertension in her father and mother.She reports that  has never smoked. she has never used smokeless tobacco. She reports that she does not drink alcohol or use drugs.  Outpatient Medications Prior to Visit  Medication Sig Dispense Refill  . ADDERALL XR 20 MG 24 hr capsule Take 40 mg by mouth every morning.  0  . buPROPion (WELLBUTRIN XL) 300 MG 24 hr tablet TAKE 1 TABLET BY MOUTH ONCE DAILY EVERY MORNING  12  . drospirenone-ethinyl estradiol (YASMIN,ZARAH,SYEDA) 3-0.03 MG tablet Take by mouth.    . vortioxetine HBr (TRINTELLIX) 5 MG TABS Take 5 mg by mouth.    . zolpidem (AMBIEN) 5 MG tablet Take 1 tablet (5 mg total) by mouth at bedtime as needed for sleep. 30 tablet 0  . buPROPion (WELLBUTRIN XL) 150 MG 24 hr tablet Take 1 tablet (150 mg total) by mouth daily. 30 tablet 1  . FLUoxetine (PROZAC) 20 MG capsule Take 1 capsule (20 mg total) by mouth daily.  (Patient not taking: Reported on 07/02/2017) 30 capsule 1  . ibuprofen (ADVIL,MOTRIN) 600 MG tablet Take 1 tablet (600 mg total) by mouth every 6 (six) hours. (Patient not taking: Reported on 07/02/2017) 30 tablet 1  . Prenatal Vit-Fe Fumarate-FA (PRENATAL MULTIVITAMIN) TABS tablet Take 1 tablet by mouth daily at 12 noon.     No facility-administered medications prior to visit.     ROS Review of Systems  Constitutional: Positive for fatigue and unexpected weight change (wt gain). Negative for appetite change and diaphoresis.  HENT: Negative.   Eyes: Negative.  Negative for visual disturbance.  Respiratory: Negative.  Negative for cough, chest tightness, shortness of breath and wheezing.   Cardiovascular: Negative for chest pain, palpitations and leg swelling.  Gastrointestinal: Positive for constipation. Negative for abdominal pain, diarrhea, nausea and vomiting.  Endocrine: Positive for polydipsia. Negative for polyphagia and polyuria.  Genitourinary: Negative.  Negative for difficulty urinating.  Musculoskeletal: Negative.   Skin: Negative.   Allergic/Immunologic: Negative.   Neurological: Negative.  Negative for headaches.  Hematological: Negative for adenopathy. Does not bruise/bleed easily.  Psychiatric/Behavioral: Positive for sleep disturbance. Negative for agitation, behavioral problems, confusion, decreased concentration, dysphoric mood and suicidal ideas. The patient is not nervous/anxious.     Objective:  BP (!) 138/100 (BP Location: Left Arm, Patient Position: Sitting, Cuff Size: Normal)   Pulse 90   Temp 98.1 F (36.7 C) (Oral)   Ht 5\' 7"  (1.702  m)   Wt 197 lb 8 oz (89.6 kg)   LMP 06/05/2017   SpO2 98%   BMI 30.93 kg/m   Physical Exam  Constitutional: She is oriented to person, place, and time. No distress.  HENT:  Mouth/Throat: Oropharynx is clear and moist. No oropharyngeal exudate.  Eyes: Conjunctivae are normal. Right eye exhibits no discharge. Left eye exhibits  no discharge. No scleral icterus.  Neck: Normal range of motion. Neck supple. No JVD present. No thyromegaly present.  Cardiovascular: Normal rate, regular rhythm and normal heart sounds. Exam reveals no gallop and no friction rub.  No murmur heard. EKG -  Sinus  Rhythm  WITHIN NORMAL LIMITS  Pulmonary/Chest: Effort normal and breath sounds normal. No respiratory distress. She has no wheezes. She has no rales. She exhibits no tenderness.  Abdominal: Soft. Bowel sounds are normal. She exhibits no distension and no mass. There is no tenderness. There is no rebound and no guarding.  Musculoskeletal: Normal range of motion. She exhibits no edema, tenderness or deformity.  Lymphadenopathy:    She has no cervical adenopathy.  Neurological: She is alert and oriented to person, place, and time.  Skin: Skin is warm and dry. No rash noted. She is not diaphoretic. No erythema. No pallor.  Psychiatric: She has a normal mood and affect. Her behavior is normal. Judgment and thought content normal.  Vitals reviewed.   Lab Results  Component Value Date   WBC 10.7 (H) 10/05/2016   HGB 10.8 (L) 10/05/2016   HCT 31.8 (L) 10/05/2016   PLT 207 10/05/2016   GLUCOSE 108 (H) 10/05/2016   CHOL 244 (H) 07/02/2017   TRIG 321.0 (H) 07/02/2017   HDL 76.60 07/02/2017   LDLDIRECT 90.0 07/02/2017   ALT 16 10/05/2016   AST 21 10/05/2016   NA 136 10/05/2016   K 3.8 10/05/2016   CL 106 10/05/2016   CREATININE 0.72 10/05/2016   BUN 14 10/05/2016   CO2 24 10/05/2016   TSH 1.53 07/02/2017   HGBA1C 5.4 07/02/2017    Assessment & Plan:   Betha LoaMarlo was seen today for annual exam, anemia and hypertension.  Diagnoses and all orders for this visit:  Routine general medical examination at a health care facility-exam completed, labs ordered, she refused a flu vaccine, will address other vaccines as indicated, her Pap smear is up-to-date, patient education material was given. -     Lipid panel; Future -     Hepatitis  A antibody, total; Future -     Hepatitis B core antibody, total; Future -     Hepatitis B surface antibody; Future  Deficiency anemia- She was mildly anemic during her recent pregnancy.  I will recheck her H&H and will screen for iron deficiency. -     CBC with Differential/Platelet; Future -     Comprehensive metabolic panel; Future -     IBC panel; Future -     Ferritin; Future  Hyperglycemia- She has mild hyperglycemia and agrees to work on her lifestyle modifications.  Medical therapy is not indicated. -     Hemoglobin A1c; Future  Essential hypertension- She has hypertension that is not adequately well controlled.  She is not willing to take an antihypertensive.  She agrees to work on her lifestyle modifications.  Her EKG is negative for LVH.  I will check her labs to screen for secondary causes and endorgan damage. -     Thyroid Panel With TSH; Future -     Urinalysis, Routine w  reflex microscopic; Future -     EKG 12-Lead  Chronic idiopathic constipation- I will check her labs to screen for secondary causes.  Will start Linzess to treat this. -     Thyroid Panel With TSH; Future -     linaclotide (LINZESS) 72 MCG capsule; Take 1 capsule (72 mcg total) by mouth daily before breakfast.   I have discontinued Makiyah Scadden's prenatal multivitamin, ibuprofen, and FLUoxetine. I am also having her start on linaclotide. Additionally, I am having her maintain her zolpidem, drospirenone-ethinyl estradiol, ADDERALL XR, buPROPion, and vortioxetine HBr.  Meds ordered this encounter  Medications  . linaclotide (LINZESS) 72 MCG capsule    Sig: Take 1 capsule (72 mcg total) by mouth daily before breakfast.    Dispense:  90 capsule    Refill:  1     Follow-up: Return in about 3 months (around 09/30/2017).  Sanda Linger, MD

## 2017-07-03 ENCOUNTER — Encounter: Payer: Self-pay | Admitting: Internal Medicine

## 2017-07-03 LAB — THYROID PANEL WITH TSH
Free Thyroxine Index: 2.5 (ref 1.4–3.8)
T3 UPTAKE: 22 % (ref 22–35)
T4, Total: 11.3 ug/dL (ref 5.1–11.9)
TSH: 1.53 mIU/L

## 2017-07-03 LAB — HEPATITIS A ANTIBODY, TOTAL: HEPATITIS A AB,TOTAL: NONREACTIVE

## 2017-07-03 LAB — HEPATITIS B CORE ANTIBODY, TOTAL: Hep B Core Total Ab: NONREACTIVE

## 2017-07-03 LAB — HEPATITIS B SURFACE ANTIBODY,QUALITATIVE: Hep B S Ab: BORDERLINE — AB

## 2017-07-05 ENCOUNTER — Encounter: Payer: Self-pay | Admitting: Internal Medicine

## 2017-07-07 ENCOUNTER — Other Ambulatory Visit: Payer: Self-pay | Admitting: Internal Medicine

## 2017-07-07 ENCOUNTER — Encounter: Payer: Self-pay | Admitting: Internal Medicine

## 2017-07-07 NOTE — Addendum Note (Signed)
Addended by: Etta GrandchildJONES, Jensyn Cambria L on: 07/07/2017 07:46 AM   Modules accepted: Orders

## 2017-07-08 ENCOUNTER — Other Ambulatory Visit: Payer: Self-pay | Admitting: Internal Medicine

## 2017-07-08 DIAGNOSIS — F5101 Primary insomnia: Secondary | ICD-10-CM | POA: Insufficient documentation

## 2017-07-08 MED ORDER — ZOLPIDEM TARTRATE 5 MG PO TABS: 5.0000 mg | ORAL_TABLET | Freq: Every evening | ORAL | 2 refills | Status: AC | PRN

## 2017-07-08 NOTE — Telephone Encounter (Signed)
Ambien rf rq

## 2017-07-15 MED FILL — TRINTELLIX 5 MG TABLET: 5 | 30 days supply | Qty: 30 | Fill #0

## 2017-07-23 MED FILL — ADDERALL XR 20 MG CAP SA: 20 | 30 days supply | Qty: 60 | Fill #0

## 2017-07-23 MED FILL — BUPROPION HCL XL 300 MG TAB: 300 | 30 days supply | Qty: 30 | Fill #3

## 2017-08-21 MED FILL — TRINTELLIX 5 MG TABLET: 5 | 30 days supply | Qty: 30 | Fill #1

## 2017-09-17 MED FILL — TRINTELLIX 5 MG TABLET: 5 | 30 days supply | Qty: 30 | Fill #0

## 2017-10-16 MED FILL — TRINTELLIX 5 MG TABLET: 5 | 30 days supply | Qty: 30 | Fill #1 | Status: TO

## 2017-10-16 MED FILL — BUPROPION HCL XL 300 MG TAB: 300 | 30 days supply | Qty: 30 | Fill #4 | Status: TO

## 2017-11-13 MED FILL — TRINTELLIX 5 MG TABLET: 5 | 30 days supply | Qty: 30 | Fill #0

## 2017-11-13 MED FILL — ADDERALL XR 20 MG CAP SA: 20 | 30 days supply | Qty: 60 | Fill #0

## 2017-11-13 MED FILL — buPROPion HCL ER (XL) 300 M: 300 | 30 days supply | Qty: 30 | Fill #0

## 2017-12-10 DIAGNOSIS — F3342 Major depressive disorder, recurrent, in full remission: Secondary | ICD-10-CM | POA: Diagnosis not present

## 2017-12-11 MED FILL — ADDERALL XR 20 MG CAP SA: 20 | 30 days supply | Qty: 60 | Fill #0

## 2017-12-11 MED FILL — TRINTELLIX 5 MG TABLET: 5 | 30 days supply | Qty: 30 | Fill #0

## 2017-12-19 ENCOUNTER — Ambulatory Visit (INDEPENDENT_AMBULATORY_CARE_PROVIDER_SITE_OTHER): Payer: 59 | Admitting: Pulmonary Disease

## 2017-12-19 ENCOUNTER — Encounter: Payer: Self-pay | Admitting: Pulmonary Disease

## 2017-12-19 VITALS — BP 132/90 | HR 105 | Ht 66.0 in | Wt 199.6 lb

## 2017-12-19 DIAGNOSIS — R059 Cough, unspecified: Secondary | ICD-10-CM

## 2017-12-19 DIAGNOSIS — R05 Cough: Secondary | ICD-10-CM | POA: Diagnosis not present

## 2017-12-19 LAB — POCT EXHALED NITRIC OXIDE: FeNO level (ppb): 6

## 2017-12-19 MED ORDER — ALBUTEROL SULFATE HFA 108 (90 BASE) MCG/ACT IN AERS: 2.0000 | INHALATION_SPRAY | Freq: Four times a day (QID) | RESPIRATORY_TRACT | 6 refills | Status: AC | PRN

## 2017-12-19 MED ORDER — CHLORPHENIRAMINE MALEATE 4 MG PO TABS: 4.0000 mg | ORAL_TABLET | Freq: Every day | ORAL | 0 refills | Status: AC

## 2017-12-19 MED ORDER — MONTELUKAST SODIUM 10 MG PO TABS: 10.0000 mg | ORAL_TABLET | Freq: Every day | ORAL | 5 refills | Status: AC

## 2017-12-19 MED FILL — MONTELUKAST SOD 10 MG TAB: 10 | 30 days supply | Qty: 30 | Fill #0

## 2017-12-19 MED FILL — VENTOLIN HFA 90 MCG INHALER: 108 (90 BAS | 25 days supply | Qty: 18 | Fill #0

## 2017-12-19 NOTE — Progress Notes (Signed)
[facility]  Chief Complaint  Patient presents with  . Acute Visit    patient complains of cough. some mucus productive     Vital signs: BP 132/90 (BP Location: Left Arm, Cuff Size: Normal)   Pulse (!) 105   Ht 5\' 6"  (1.676 m)   Wt 199 lb 9.6 oz (90.5 kg)   SpO2 99%   BMI 32.22 kg/m   History of Present Illness: Angel Quinn is a 37 y.o. female with cough.  She has intermittent cough.  She was on ABx and prednisone recently and this helped.  Her cough is sporadic.  She can bring up clear sputum.  Has to clear her throat, but denies globus sensation.  Cough can wake her up from sleep.  She has some drainage her sinuses.  Tried nose sprays w/o benefit.  Tried primatine w/o benefit.  No prior hx of asthma or allergies.   Physical Exam:  General - pleasant Eyes - pupils reactive ENT - no sinus tenderness, no oral exudate, no LAN, narrow nasal passages, MP 3, scalloped tongue Cardiac - regular, no murmur Chest - no wheeze, rales Abd - soft, non tender Ext - no edema Skin - no rashes Neuro - normal strength Psych - normal mood  Assessment/Plan:  Cough. - suspect post nasal drip  - might have allergic component, but seems less likely - has been on reflux tx w/o help - dymista samples given - singulair, chlorpheniramine, prn albuterol - if no improvement, then might need full PFT/CXR/sinus imaging/allergy testing - monitor sleep pattern for signs of sleep apnea   Patient Instructions  Dymista one spray each nostril twice per day Singulair 10 mg pill nightly Chlorpheniramine 4 mg pill nightly Albuterol two puffs every 6 hours as needed for cough Will call to schedule follow up     Coralyn Helling, MD [facility] 12/19/2017, 12:56 PM  Flow Sheet  Pulmonary tests: Spirometry 12/19/17 >> FEV1 3.47 (105%), FEV1% 88 FeNO 12/19/17 >> 5  Past Medical History: She  has a past medical history of Pregnancy induced  hypertension.  Past Surgical History: She  has a past surgical history that includes Back surgery; Wisdom tooth extraction; and Cesarean section (N/A, 10/04/2016).  Family History: Her family history includes Aortic stenosis in her father; Diabetes in her father, mother, and sister; Hyperlipidemia in her father and mother; Hypertension in her father and mother.  Social History: She  reports that she has never smoked. She has never used smokeless tobacco. She reports that she does not drink alcohol or use drugs.  Medications: Allergies as of 12/19/2017   No Known Allergies     Medication List        Accurate as of 12/19/17 12:56 PM. Always use your most recent med list.          ADDERALL XR 20 MG 24 hr capsule Generic drug:  amphetamine-dextroamphetamine Take 40 mg by mouth every morning.   albuterol 108 (90 Base) MCG/ACT inhaler Commonly known as:  PROVENTIL HFA;VENTOLIN HFA Inhale 2 puffs into the lungs every 6 (six) hours as needed for wheezing or shortness of breath.   buPROPion 300 MG 24 hr tablet Commonly known as:  WELLBUTRIN XL TAKE 1 TABLET BY MOUTH ONCE DAILY EVERY MORNING   chlorpheniramine 4 MG tablet Commonly known as:  CHLOR-TRIMETON Take 1 tablet (4 mg total) by mouth at bedtime.   drospirenone-ethinyl estradiol 3-0.03 MG tablet Commonly known as:  YASMIN,ZARAH,SYEDA Take by mouth.  montelukast 10 MG tablet Commonly known as:  SINGULAIR Take 1 tablet (10 mg total) by mouth at bedtime.   TRINTELLIX 5 MG Tabs tablet Generic drug:  vortioxetine HBr Take 5 mg by mouth.   zolpidem 5 MG tablet Commonly known as:  AMBIEN Take 1 tablet (5 mg total) by mouth at bedtime as needed for sleep.

## 2017-12-19 NOTE — Patient Instructions (Addendum)
Dymista one spray each nostril twice per day Singulair 10 mg pill nightly Chlorpheniramine 4 mg pill nightly Albuterol two puffs every 6 hours as needed for cough Will call to schedule follow up

## 2017-12-29 MED FILL — buPROPion HCL ER (XL) 300 M: 300 | 30 days supply | Qty: 30 | Fill #1

## 2018-01-20 MED FILL — TRINTELLIX 5 MG TABLET: 5 | 30 days supply | Qty: 30 | Fill #1

## 2018-01-21 MED FILL — ADDERALL XR 20 MG CAP SA: 20 | 30 days supply | Qty: 60 | Fill #0

## 2018-01-22 MED FILL — buPROPion HCL ER (XL) 300 M: 300 | 30 days supply | Qty: 30 | Fill #2

## 2018-03-03 MED FILL — buPROPion HCL ER (XL) 300 M: 300 | 30 days supply | Qty: 30 | Fill #3

## 2018-03-03 MED FILL — TRINTELLIX 5 MG TABLET: 5 | 30 days supply | Qty: 30 | Fill #2

## 2018-03-05 MED FILL — ADDERALL XR 20 MG CAP SA: 20 | 30 days supply | Qty: 60 | Fill #0

## 2018-04-03 MED FILL — TRINTELLIX 5 MG TABLET: 5 | 30 days supply | Qty: 30 | Fill #3

## 2018-04-03 MED FILL — buPROPion HCL ER (XL) 300 M: 300 | 30 days supply | Qty: 30 | Fill #4

## 2018-04-06 MED FILL — ADDERALL XR 20 MG CAP SA: 20 | 30 days supply | Qty: 60 | Fill #0

## 2018-04-24 IMAGING — MR MR LUMBAR SPINE W/O CM
4 of 5 series · 18 of 48 positions shown · non-contrast
Comparison: 02/18/2014.  03/04/2014.

CLINICAL DATA: Right-sided low back pain extending to the right
leg, 2 months duration. Previous discectomy 03/04/2014.

EXAM:
MRI LUMBAR SPINE WITHOUT CONTRAST
TECHNIQUE: Multiplanar, multisequence MR imaging of the lumbar spine was
performed. No intravenous contrast was administered.

[Series 6: T2 · sagittal · 4.0mm · 0.73mm/px · 6 of 15 slices shown (1 of 2)]
[im 1/15]
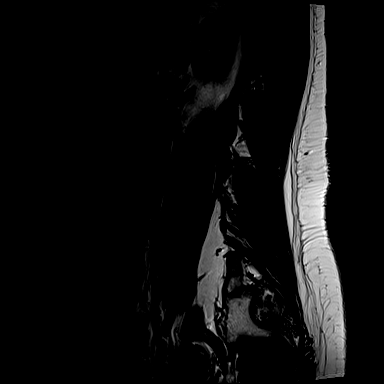
[im 3/15]
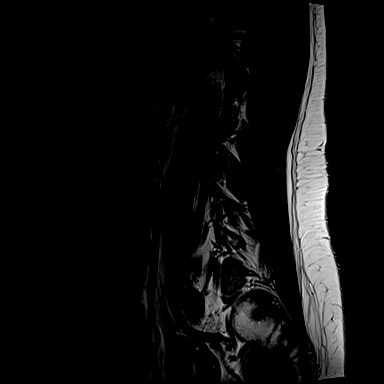
[im 6/15]
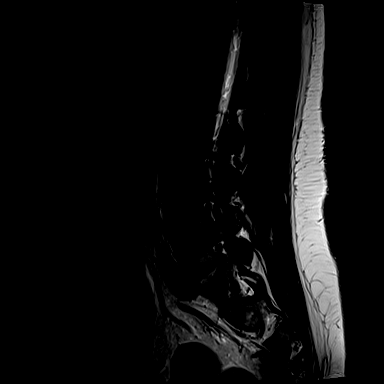
[im 9/15]
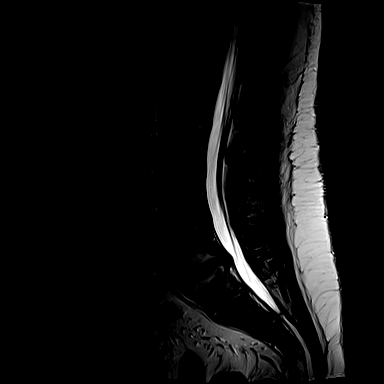
[im 12/15]
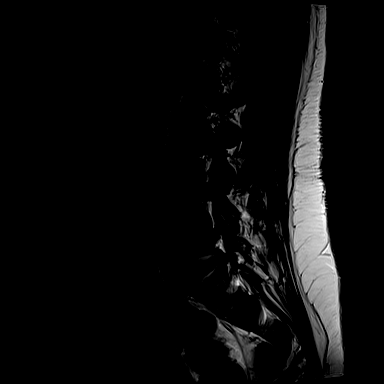
[im 15/15]
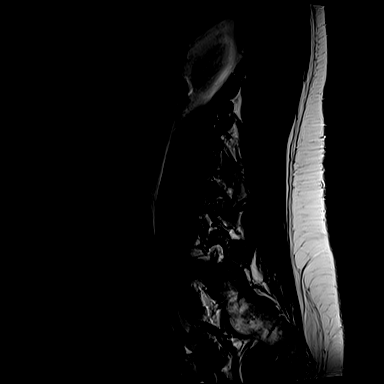

[Series 7: T1 · sagittal · 4.0mm · 0.73mm/px · 3 of 15 slices shown (1 of 2)]
[im 3/15]
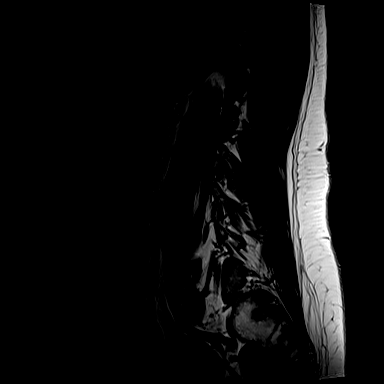
[im 9/15]
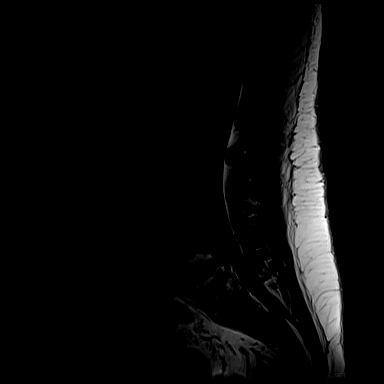
[im 15/15]
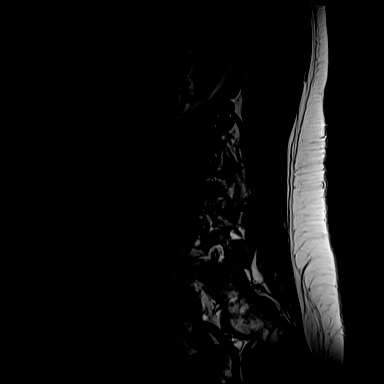

[Series 14: T2 · axial · 4.0mm · 0.28mm/px · z∈[-101,+80]mm · 6 of 39 slices shown (2 of 2)]
[im 1/39]
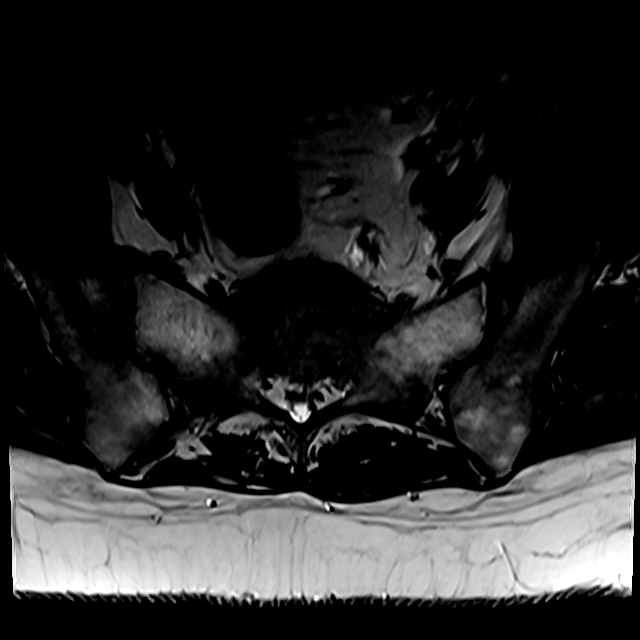
[im 6/39]
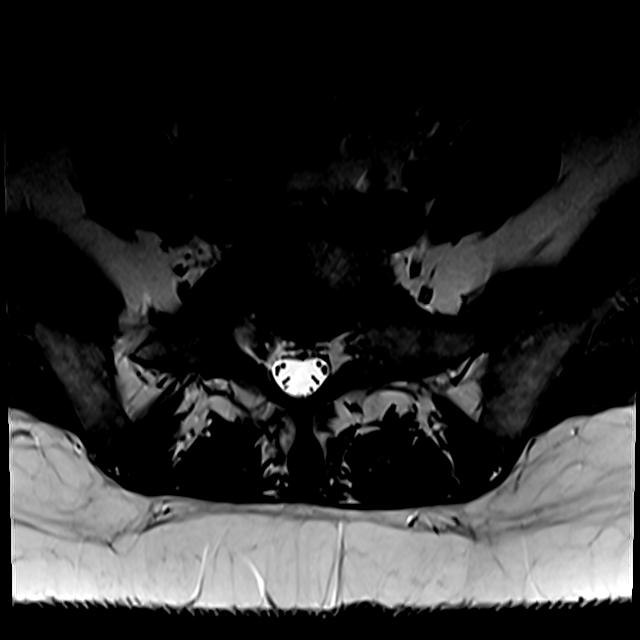
[im 11/39]
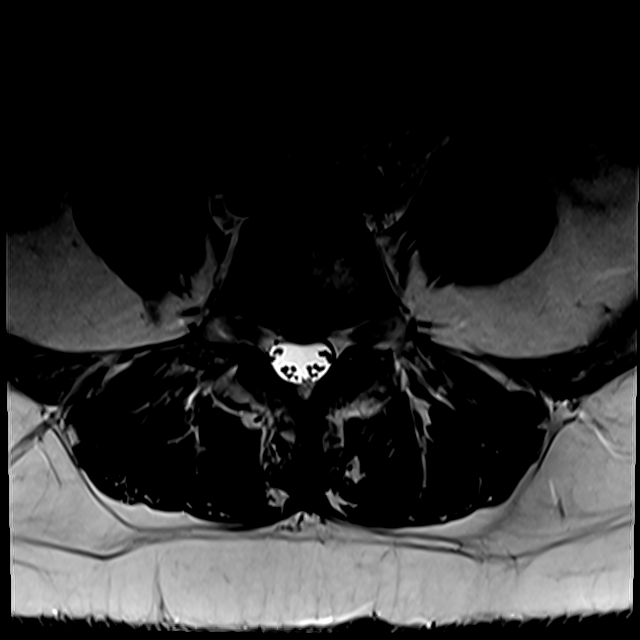
[im 17/39]
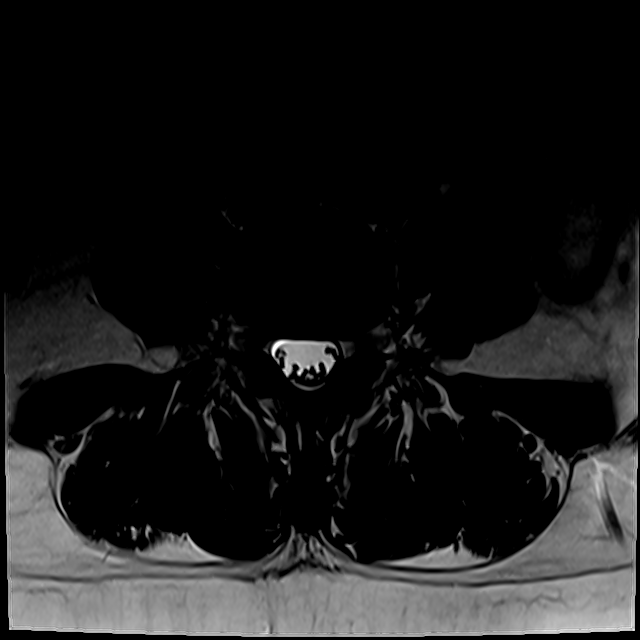
[im 20/39]
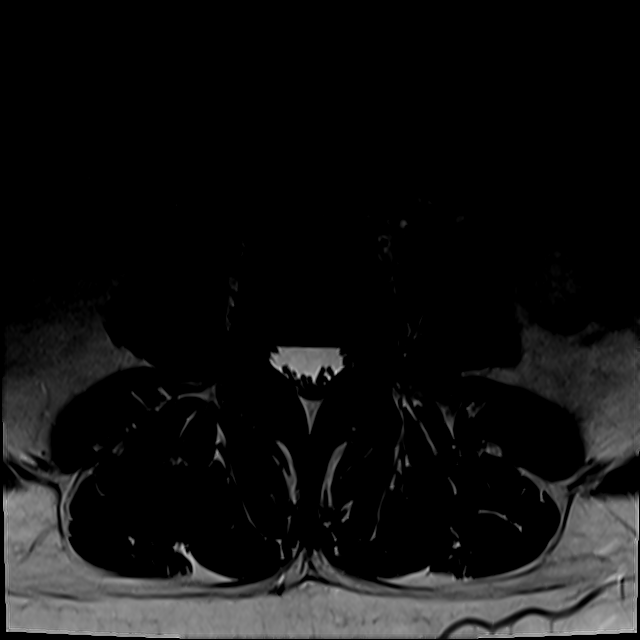
[im 33/39]
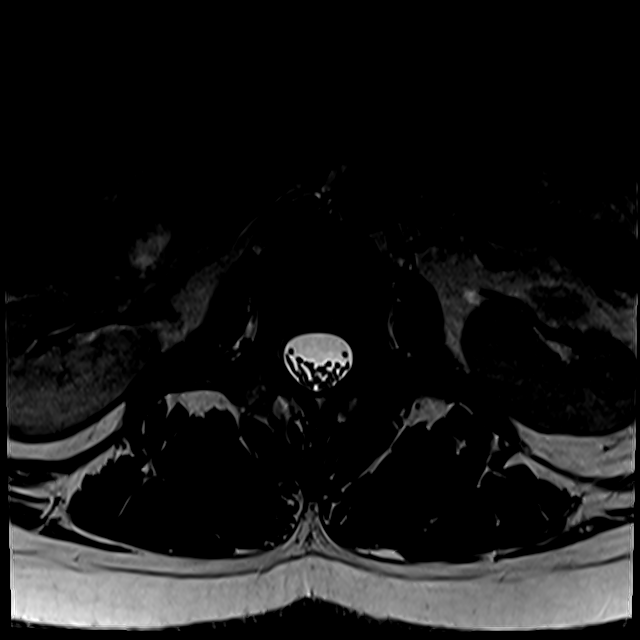

[Series 100: T1 · axial · 4.0mm · 0.28mm/px · z∈[-78,+80]mm · 3 of 39 slices shown (2 of 2)]
[im 6/39]
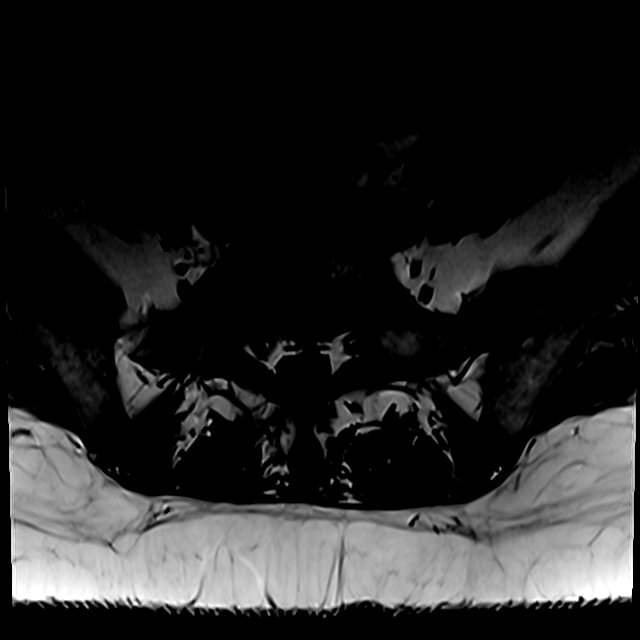
[im 20/39]
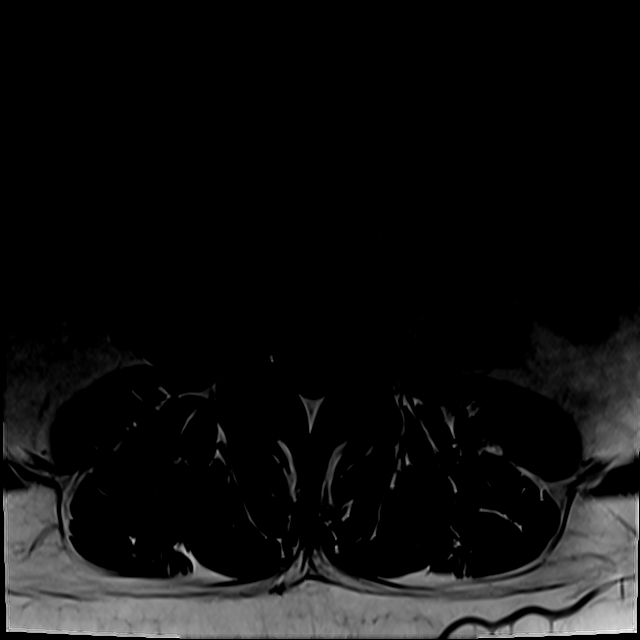
[im 33/39]
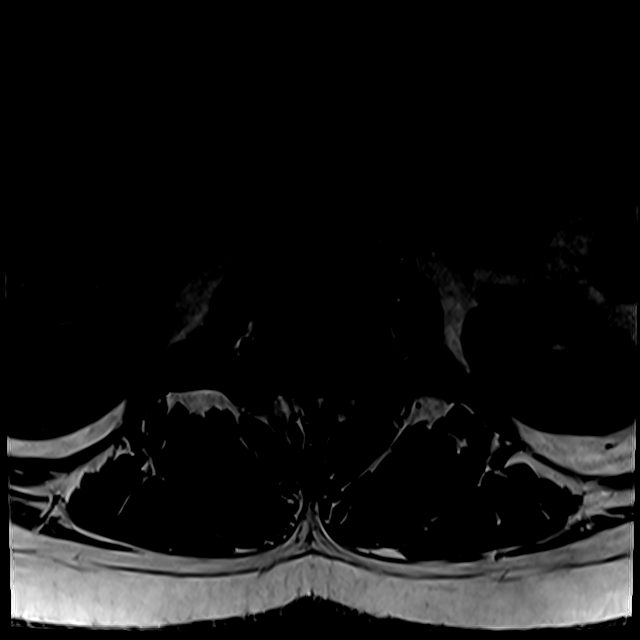

[18 of 48 positions shown; findings below may reference images not displayed]

FINDINGS: Segmentation: 5 lumbar type vertebral bodies. L5 is somewhat
transitional. Same numbering terminology as utilized previously.

Alignment:  Normal

Vertebrae:  Chronic discogenic marrow changes at the L4-5 level.

Conus medullaris: Extends to the L1 level and appears normal.

Paraspinal and other soft tissues: Negative

Disc levels:

No significant finding at L3-4 or above.

L4-5: Previous right hemilaminectomy and discectomy. Recurrent right
posterolateral disc herniation with a 7 mm fragment in the right
lateral recess likely to compress the right L5 nerve. Shallow
protrusion with caudal down turning more broad-based.

L5-S1: Transitional and unremarkable level.
IMPRESSION: Again L5 is described as a transitional vertebra.

Previous right hemilaminectomy and discectomy at L4-5. Recurrent
right posterolateral disc herniation with a 7 mm fragment in the
right lateral recess likely to compress the right L5 nerve.
Broad-based disc protrusion with caudal down turning in addition.
Chronic discogenic marrow changes at this level.

## 2018-05-05 MED FILL — buPROPion HCL ER (XL) 300 M: 300 | 30 days supply | Qty: 30 | Fill #5

## 2018-05-05 MED FILL — TRINTELLIX 5 MG TABLET: 5 | 30 days supply | Qty: 30 | Fill #4

## 2018-05-11 MED FILL — ADDERALL XR 20 MG CAP SA: 20 | 30 days supply | Qty: 60 | Fill #0

## 2018-06-09 MED FILL — TRINTELLIX 5 MG TABLET: 5 | 30 days supply | Qty: 30 | Fill #5

## 2018-06-09 MED FILL — buPROPion HCL ER (XL) 300 M: 300 | 30 days supply | Qty: 30 | Fill #6

## 2018-06-10 DIAGNOSIS — F3342 Major depressive disorder, recurrent, in full remission: Secondary | ICD-10-CM | POA: Diagnosis not present

## 2018-06-10 DIAGNOSIS — F900 Attention-deficit hyperactivity disorder, predominantly inattentive type: Secondary | ICD-10-CM | POA: Diagnosis not present

## 2018-06-10 MED FILL — ADDERALL XR 20 MG CAP SA: 20 | 30 days supply | Qty: 60 | Fill #0

## 2018-06-25 DIAGNOSIS — Z6833 Body mass index (BMI) 33.0-33.9, adult: Secondary | ICD-10-CM | POA: Diagnosis not present

## 2018-06-25 DIAGNOSIS — Z01419 Encounter for gynecological examination (general) (routine) without abnormal findings: Secondary | ICD-10-CM | POA: Diagnosis not present

## 2018-06-25 MED FILL — ZOLPIDEM TARTRATE 5 MG TAB: 5 | 30 days supply | Qty: 30 | Fill #0

## 2018-06-25 MED FILL — DROSPIRENONE-EE 3-0.03 MG T: 3-0.03 | 84 days supply | Qty: 84 | Fill #0

## 2018-07-13 MED FILL — buPROPion HCL ER (XL) 300 M: 300 | 30 days supply | Qty: 30 | Fill #7

## 2018-07-13 MED FILL — TRINTELLIX 5 MG TABLET: 5 | 30 days supply | Qty: 30 | Fill #6

## 2018-07-14 MED FILL — ADDERALL XR 20 MG CAP SA: 20 | 30 days supply | Qty: 60 | Fill #0

## 2018-08-18 MED FILL — ADDERALL XR 20 MG CAP SA: 20 | 30 days supply | Qty: 60 | Fill #0

## 2018-08-18 MED FILL — buPROPion HCL ER (XL) 300 M: 300 | 30 days supply | Qty: 30 | Fill #0

## 2018-08-18 MED FILL — TRINTELLIX 5 MG TABLET: 5 | 30 days supply | Qty: 30 | Fill #0

## 2018-09-14 MED FILL — buPROPion HCL ER (XL) 300 M: 300 | 30 days supply | Qty: 30 | Fill #1 | Status: TO

## 2018-09-14 MED FILL — TRINTELLIX 5 MG TABLET: 5 | 30 days supply | Qty: 30 | Fill #1 | Status: TO

## 2018-09-15 MED FILL — ADDERALL XR 20 MG CAP SA: 20 | 30 days supply | Qty: 60 | Fill #0

## 2018-10-19 MED FILL — TRINTELLIX 5 MG TABLET: 5 | 30 days supply | Qty: 30 | Fill #0 | Status: TO

## 2018-10-19 MED FILL — buPROPion HCL ER (XL) 300 M: 300 | 30 days supply | Qty: 30 | Fill #0

## 2018-10-20 MED FILL — ADDERALL XR 20 MG CAP SA: 20 | 30 days supply | Qty: 60 | Fill #0

## 2018-11-19 MED FILL — ADDERALL XR 20 MG CAP SA: 20 | 30 days supply | Qty: 60 | Fill #0

## 2018-11-24 MED FILL — buPROPion HCL ER (XL) 300 M: 300 | 30 days supply | Qty: 30 | Fill #0

## 2018-11-24 MED FILL — TRINTELLIX 5 MG TABLET: 5 | 30 days supply | Qty: 30 | Fill #0

## 2018-12-02 DIAGNOSIS — F3342 Major depressive disorder, recurrent, in full remission: Secondary | ICD-10-CM | POA: Diagnosis not present

## 2018-12-22 MED FILL — ADDERALL XR 20 MG CAP SA: 20 | 30 days supply | Qty: 60 | Fill #0

## 2018-12-22 MED FILL — TRINTELLIX 5 MG TABLET: 5 | 30 days supply | Qty: 30 | Fill #0

## 2018-12-22 MED FILL — buPROPion HCL ER (XL) 300 M: 300 | 30 days supply | Qty: 30 | Fill #0

## 2019-01-25 MED FILL — TRINTELLIX 5 MG TABLET: 5 | 30 days supply | Qty: 30 | Fill #1

## 2019-01-25 MED FILL — buPROPion HCL ER (XL) 300 M: 300 | 30 days supply | Qty: 30 | Fill #1

## 2019-01-25 MED FILL — ADDERALL XR 20 MG CAP SA: 20 | 30 days supply | Qty: 60 | Fill #0

## 2019-02-24 MED FILL — buPROPion HCL ER (XL) 300 M: 300 | 30 days supply | Qty: 30 | Fill #2

## 2019-02-24 MED FILL — TRINTELLIX 5 MG TABLET: 5 | 30 days supply | Qty: 30 | Fill #2

## 2019-02-24 MED FILL — ADDERALL XR 20 MG CAP SA: 20 | 30 days supply | Qty: 60 | Fill #0

## 2019-03-26 MED FILL — ADDERALL XR 20 MG CAP SA: 20 | 30 days supply | Qty: 60 | Fill #0

## 2019-04-08 MED FILL — TRINTELLIX 5 MG TABLET: 5 | 30 days supply | Qty: 30 | Fill #3

## 2019-04-08 MED FILL — buPROPion HCL ER (XL) 300 M: 300 | 30 days supply | Qty: 30 | Fill #3

## 2019-04-27 MED FILL — ADDERALL XR 20 MG CAP SA: 20 | 30 days supply | Qty: 60 | Fill #0

## 2019-05-03 MED FILL — TRINTELLIX 5 MG TABLET: 5 | 30 days supply | Qty: 30 | Fill #4

## 2019-05-31 MED FILL — ADDERALL XR 20 MG CAP SA: 20 | 30 days supply | Qty: 60 | Fill #0

## 2019-05-31 MED FILL — TRINTELLIX 5 MG TABLET: 5 | 30 days supply | Qty: 30 | Fill #5

## 2019-05-31 MED FILL — BUPROPION HCL ER (XL) 300 M: 300 | 30 days supply | Qty: 30 | Fill #4

## 2019-06-24 MED FILL — ADDERALL XR 20 MG CAP SA: 20 | 8 days supply | Qty: 16 | Fill #0

## 2019-07-01 DIAGNOSIS — F900 Attention-deficit hyperactivity disorder, predominantly inattentive type: Secondary | ICD-10-CM | POA: Diagnosis not present

## 2019-07-01 DIAGNOSIS — F3342 Major depressive disorder, recurrent, in full remission: Secondary | ICD-10-CM | POA: Diagnosis not present

## 2019-07-01 MED FILL — ADDERALL XR 20 MG CAP SA: 20 | 90 days supply | Qty: 180 | Fill #0

## 2019-07-01 MED FILL — BUPROPION HCL XL 300 MG TAB: 300 | 30 days supply | Qty: 30 | Fill #0

## 2019-07-01 MED FILL — TRINTELLIX 5 MG TABLET: 5 | 90 days supply | Qty: 90 | Fill #0

## 2019-08-12 DIAGNOSIS — Z808 Family history of malignant neoplasm of other organs or systems: Secondary | ICD-10-CM | POA: Diagnosis not present

## 2019-08-12 DIAGNOSIS — R87810 Cervical high risk human papillomavirus (HPV) DNA test positive: Secondary | ICD-10-CM | POA: Diagnosis not present

## 2019-08-12 DIAGNOSIS — Z8051 Family history of malignant neoplasm of kidney: Secondary | ICD-10-CM | POA: Diagnosis not present

## 2019-08-12 DIAGNOSIS — Z01419 Encounter for gynecological examination (general) (routine) without abnormal findings: Secondary | ICD-10-CM | POA: Diagnosis not present

## 2019-08-12 DIAGNOSIS — Z6832 Body mass index (BMI) 32.0-32.9, adult: Secondary | ICD-10-CM | POA: Diagnosis not present

## 2019-08-12 DIAGNOSIS — Z8052 Family history of malignant neoplasm of bladder: Secondary | ICD-10-CM | POA: Diagnosis not present

## 2019-09-14 MED FILL — TRINTELLIX 5 MG TABLET: 5 | 90 days supply | Qty: 90 | Fill #1

## 2019-09-14 MED FILL — buPROPion HCL ER (XL) 300 M: 300 | 90 days supply | Qty: 90 | Fill #1

## 2019-09-27 DIAGNOSIS — Z7183 Encounter for nonprocreative genetic counseling: Secondary | ICD-10-CM | POA: Diagnosis not present

## 2019-09-27 MED FILL — ADDERALL XR 20 MG CAP SA: 20 | 90 days supply | Qty: 180 | Fill #0

## 2020-01-04 MED FILL — AMPHETAMINE-DEXTROAMPHET ER: 20 | 90 days supply | Qty: 180 | Fill #0

## 2020-01-04 MED FILL — buPROPion HCL ER (XL) 300 M: 300 | 90 days supply | Qty: 90 | Fill #2

## 2020-04-26 MED FILL — buPROPion HCL ER (XL) 300 M: 300 | 90 days supply | Qty: 90 | Fill #3

## 2020-05-02 ENCOUNTER — Other Ambulatory Visit (HOSPITAL_COMMUNITY): Payer: Self-pay | Admitting: Specialist

## 2020-05-02 MED FILL — AMPHETAMINE-DEXTROAMPHET ER: 20 | 90 days supply | Qty: 180 | Fill #0

## 2020-06-30 ENCOUNTER — Other Ambulatory Visit (HOSPITAL_COMMUNITY): Payer: Self-pay | Admitting: Specialist

## 2020-07-07 MED FILL — TRINTELLIX 5 MG TABLET: 5 | 90 days supply | Qty: 90 | Fill #0

## 2020-09-05 MED FILL — TRINTELLIX 5 MG TABLET: 5 | 30 days supply | Qty: 30 | Fill #2

## 2020-09-19 ENCOUNTER — Other Ambulatory Visit (HOSPITAL_BASED_OUTPATIENT_CLINIC_OR_DEPARTMENT_OTHER): Payer: Self-pay

## 2020-11-13 ENCOUNTER — Other Ambulatory Visit (HOSPITAL_COMMUNITY): Payer: Self-pay

## 2023-04-17 ENCOUNTER — Other Ambulatory Visit: Payer: Self-pay
